# Patient Record
Sex: Female | Born: 1965 | Race: White | Hispanic: No | Marital: Married | Smoking: Never smoker
Health system: Southern US, Community
[De-identification: ages and names within clinical notes are randomized; demographics above are authoritative.]

## PROBLEM LIST (undated history)

## (undated) DIAGNOSIS — L723 Sebaceous cyst: Secondary | ICD-10-CM

## (undated) DIAGNOSIS — L4050 Arthropathic psoriasis, unspecified: Secondary | ICD-10-CM

## (undated) DIAGNOSIS — F419 Anxiety disorder, unspecified: Secondary | ICD-10-CM

## (undated) DIAGNOSIS — Z923 Personal history of irradiation: Secondary | ICD-10-CM

## (undated) DIAGNOSIS — C50919 Malignant neoplasm of unspecified site of unspecified female breast: Secondary | ICD-10-CM

## (undated) DIAGNOSIS — I10 Essential (primary) hypertension: Secondary | ICD-10-CM

## (undated) HISTORY — DX: Anxiety disorder, unspecified: F41.9

## (undated) HISTORY — DX: Personal history of irradiation: Z92.3

## (undated) HISTORY — PX: TOTAL ABDOMINAL HYSTERECTOMY: SHX209

## (undated) HISTORY — PX: TONSILLECTOMY: SUR1361

## (undated) HISTORY — DX: Malignant neoplasm of unspecified site of unspecified female breast: C50.919

## (undated) HISTORY — DX: Essential (primary) hypertension: I10

## (undated) HISTORY — DX: Arthropathic psoriasis, unspecified: L40.50

---

## 2007-02-04 ENCOUNTER — Encounter: Admission: RE | Admit: 2007-02-04 | Discharge: 2007-02-04 | Payer: Self-pay | Admitting: Family Medicine

## 2007-12-12 ENCOUNTER — Encounter: Admission: RE | Admit: 2007-12-12 | Discharge: 2007-12-12 | Payer: Self-pay | Admitting: Family Medicine

## 2008-03-07 ENCOUNTER — Encounter: Admission: RE | Admit: 2008-03-07 | Discharge: 2008-03-07 | Payer: Self-pay | Admitting: Family Medicine

## 2008-11-29 ENCOUNTER — Other Ambulatory Visit: Admission: RE | Admit: 2008-11-29 | Discharge: 2008-11-29 | Payer: Self-pay | Admitting: Family Medicine

## 2008-12-26 ENCOUNTER — Encounter: Admission: RE | Admit: 2008-12-26 | Discharge: 2008-12-26 | Payer: Self-pay | Admitting: Obstetrics and Gynecology

## 2009-01-07 ENCOUNTER — Encounter: Admission: RE | Admit: 2009-01-07 | Discharge: 2009-01-07 | Payer: Self-pay | Admitting: Obstetrics and Gynecology

## 2009-03-08 ENCOUNTER — Encounter: Admission: RE | Admit: 2009-03-08 | Discharge: 2009-03-08 | Payer: Self-pay | Admitting: Family Medicine

## 2009-03-28 ENCOUNTER — Encounter (INDEPENDENT_AMBULATORY_CARE_PROVIDER_SITE_OTHER): Payer: Self-pay | Admitting: Obstetrics and Gynecology

## 2009-03-28 ENCOUNTER — Inpatient Hospital Stay (HOSPITAL_COMMUNITY): Admission: RE | Admit: 2009-03-28 | Discharge: 2009-03-30 | Payer: Self-pay | Admitting: Obstetrics and Gynecology

## 2010-03-10 ENCOUNTER — Encounter: Admission: RE | Admit: 2010-03-10 | Discharge: 2010-03-10 | Payer: Self-pay | Admitting: Physician Assistant

## 2010-09-03 ENCOUNTER — Ambulatory Visit (HOSPITAL_COMMUNITY)
Admission: RE | Admit: 2010-09-03 | Discharge: 2010-09-03 | Payer: Self-pay | Source: Home / Self Care | Attending: Rheumatology | Admitting: Rheumatology

## 2010-11-22 LAB — CBC
HCT: 35.2 % — ABNORMAL LOW (ref 36.0–46.0)
Hemoglobin: 12.2 g/dL (ref 12.0–15.0)
Hemoglobin: 14.2 g/dL (ref 12.0–15.0)
MCHC: 34.5 g/dL (ref 30.0–36.0)
MCV: 97.5 fL (ref 78.0–100.0)
Platelets: 194 10*3/uL (ref 150–400)
RBC: 3.61 MIL/uL — ABNORMAL LOW (ref 3.87–5.11)
RBC: 4.29 MIL/uL (ref 3.87–5.11)
RDW: 13.2 % (ref 11.5–15.5)
RDW: 13.2 % (ref 11.5–15.5)

## 2010-11-22 LAB — URINALYSIS, ROUTINE W REFLEX MICROSCOPIC
Ketones, ur: NEGATIVE mg/dL
Protein, ur: NEGATIVE mg/dL
Urobilinogen, UA: 0.2 mg/dL (ref 0.0–1.0)
pH: 6.5 (ref 5.0–8.0)

## 2010-11-22 LAB — TYPE AND SCREEN
ABO/RH(D): A POS
Antibody Screen: NEGATIVE

## 2010-11-22 LAB — ABO/RH: ABO/RH(D): A POS

## 2010-11-22 LAB — BASIC METABOLIC PANEL
Calcium: 9.8 mg/dL (ref 8.4–10.5)
GFR calc Af Amer: >60 mL/min (ref >60)
Sodium: 135 mEq/L (ref 135–145)

## 2010-11-22 LAB — PREGNANCY, URINE: Preg Test, Ur: NEGATIVE

## 2010-11-22 LAB — URINE MICROSCOPIC-ADD ON

## 2010-11-22 LAB — PROTIME-INR: INR: 1.1 (ref 0.00–1.49)

## 2010-12-30 NOTE — Op Note (Signed)
[name], [name]               ACCOUNT NO.:  [id number]   MEDICAL RECORD NO.:  [id number]          PATIENT TYPE:  INP   LOCATION:  9315                          FACILITY:  [facility]   PHYSICIAN:  Gerald Leitz, MD          DATE OF BIRTH:  03-10-1966   DATE OF PROCEDURE:  03/28/2009  DATE OF DISCHARGE:                               OPERATIVE REPORT   PREOPERATIVE DIAGNOSES:  1. Uterine fibroids.  2. Menorrhagia.   POSTOPERATIVE DIAGNOSES:  1. Uterine fibroids.  2. Menorrhagia.   PROCEDURE:  Total abdominal hysterectomy.   SURGEON:  Gerald Leitz, MD   ASSISTANT:  Bing Neighbors. Delcambre, MD   ANESTHESIA:  General.   COMPLICATIONS:  None.   ESTIMATED BLOOD LOSS:  300 mL.   FLUIDS:  2600 mL.   URINE OUTPUT:  400 mL of clear urine at the end of the procedure.   FINDINGS:  Irregularly enlarged uterus approximately 18 week size,  normal fallopian tubes, and ovaries.  Frozen section of pedunculated  uterine fibroid revealed benign necrotic myoma.   PROCEDURE:  The patient was taken to the operating room where she was  placed under general anesthesia.  She was prepped and draped in the  usual sterile fashion.  A vertical skin incision was made with a scalpel  and carried down to the underlying layer of fascia.  The fascia was  incised and extended superiorly inferiorly.  The rectus muscles were  separated.  The peritoneum was identified, tented up, and entered  sharply with Metzenbaum scissors and peritoneal incision was extended  superiorly and inferiorly with good visualization of the bladder.  The  pelvis was examined with the findings noted above.  A Balfour retractor  was placed into the incision.  The bowel was packed away with moist  laparotomy sponges.  The clamps were placed on the cornu and used for  retraction.  Round ligaments on both sides were clamped, transected, and  suture-ligated with 0 Vicryl.  The anterior leaf of the broad ligament  was incised along the bladder  reflection to the midline from both sides.  The bladder was then gently dissected off the lower uterine segment and  the cervix of the sponge stick.  The utero-ovarian ligaments on both  sides were then clamped, transected, and suture-ligated with 0 Vicryl.  Excellent hemostasis was noted.  Uterine arteries were skeletonized  bilaterally, clamped with Heaney clamps, transected, and suture-ligated  with 0 Vicryl.  Again, excellent hemostasis was noted.  Due to the size  of the fibroid uterus, once the uterine arteries were ligated.  The body  of the uterus was amputated from the cervix with scalpel.  The remaining  cervical stump was grasped with Kocher clamps.  The uterosacral  ligaments were then clamped on both sides, transected, and suture-  ligated with 0 Vicryl.  The cervix was amputated with Jorgenson  scissors.  The vaginal cuff angles were closed with 0 Vicryl and  transfixed to the ipsilateral uterosacral ligament.  The remainder of  cuff was closed with 0 Vicryl in a running locked  fashion.  Excellent  hemostasis was noted.  The pelvis was copiously irrigated with normal  saline.  The right fallopian tube and ovary were anchored to the right  round ligament.  All laparotomy sponges and instruments removed from the  abdomen.  The fascia was closed with double stranded 0 PDS.  Subcutaneous tissue was closed.  Adipose tissue was reapproximated with  2-0 Vicryl.  The skin was closed with staples.  Sponge, lap, and needle  counts were correct x2.  The patient was taken to the recovery room  awake and in stable condition.      Gerald Leitz, MD  Electronically Signed     TC/MEDQ  D:  03/28/2009  T:  03/29/2009  Job:  [id number]

## 2011-02-27 ENCOUNTER — Other Ambulatory Visit: Payer: Self-pay | Admitting: Dermatology

## 2011-02-27 ENCOUNTER — Other Ambulatory Visit: Payer: Self-pay | Admitting: Physician Assistant

## 2011-02-27 DIAGNOSIS — Z1231 Encounter for screening mammogram for malignant neoplasm of breast: Secondary | ICD-10-CM

## 2011-03-02 ENCOUNTER — Other Ambulatory Visit: Payer: Self-pay | Admitting: Gastroenterology

## 2011-03-12 ENCOUNTER — Other Ambulatory Visit: Payer: Self-pay

## 2011-03-17 ENCOUNTER — Ambulatory Visit: Payer: Self-pay

## 2011-03-19 ENCOUNTER — Other Ambulatory Visit: Payer: Self-pay

## 2011-03-20 ENCOUNTER — Ambulatory Visit
Admission: RE | Admit: 2011-03-20 | Discharge: 2011-03-20 | Disposition: A | Discharge status: Home / Self Care | Payer: BC Managed Care – PPO | Source: Ambulatory Visit | Attending: Physician Assistant | Admitting: Physician Assistant

## 2011-03-20 ENCOUNTER — Other Ambulatory Visit: Payer: Self-pay | Admitting: Family Medicine

## 2011-03-20 DIAGNOSIS — Z1231 Encounter for screening mammogram for malignant neoplasm of breast: Secondary | ICD-10-CM

## 2012-04-25 ENCOUNTER — Other Ambulatory Visit: Payer: Self-pay | Admitting: Family Medicine

## 2012-04-25 DIAGNOSIS — Z1231 Encounter for screening mammogram for malignant neoplasm of breast: Secondary | ICD-10-CM

## 2012-05-11 ENCOUNTER — Ambulatory Visit
Admission: RE | Admit: 2012-05-11 | Discharge: 2012-05-11 | Disposition: A | Discharge status: Home / Self Care | Payer: BC Managed Care – PPO | Source: Ambulatory Visit | Attending: Family Medicine | Admitting: Family Medicine

## 2012-05-11 DIAGNOSIS — Z1231 Encounter for screening mammogram for malignant neoplasm of breast: Secondary | ICD-10-CM

## 2012-12-08 ENCOUNTER — Ambulatory Visit (INDEPENDENT_AMBULATORY_CARE_PROVIDER_SITE_OTHER): Payer: BC Managed Care – PPO | Admitting: Internal Medicine

## 2012-12-08 ENCOUNTER — Encounter: Payer: Self-pay | Admitting: Internal Medicine

## 2012-12-08 ENCOUNTER — Other Ambulatory Visit (HOSPITAL_COMMUNITY): Payer: Self-pay | Admitting: Rheumatology

## 2012-12-08 ENCOUNTER — Ambulatory Visit (HOSPITAL_COMMUNITY)
Admission: RE | Admit: 2012-12-08 | Discharge: 2012-12-08 | Disposition: A | Discharge status: Home / Self Care | Payer: BC Managed Care – PPO | Source: Ambulatory Visit | Attending: Rheumatology | Admitting: Rheumatology

## 2012-12-08 ENCOUNTER — Telehealth: Payer: Self-pay | Admitting: Internal Medicine

## 2012-12-08 VITALS — BP 126/82 | HR 81 | Temp 98.1°F | Ht 66.0 in | Wt 200.2 lb

## 2012-12-08 DIAGNOSIS — R05 Cough: Secondary | ICD-10-CM | POA: Insufficient documentation

## 2012-12-08 DIAGNOSIS — R7611 Nonspecific reaction to tuberculin skin test without active tuberculosis: Secondary | ICD-10-CM | POA: Insufficient documentation

## 2012-12-08 DIAGNOSIS — I10 Essential (primary) hypertension: Secondary | ICD-10-CM | POA: Insufficient documentation

## 2012-12-08 DIAGNOSIS — R0989 Other specified symptoms and signs involving the circulatory and respiratory systems: Secondary | ICD-10-CM | POA: Insufficient documentation

## 2012-12-08 DIAGNOSIS — J40 Bronchitis, not specified as acute or chronic: Secondary | ICD-10-CM

## 2012-12-08 DIAGNOSIS — R7612 Nonspecific reaction to cell mediated immunity measurement of gamma interferon antigen response without active tuberculosis: Secondary | ICD-10-CM

## 2012-12-08 DIAGNOSIS — R059 Cough, unspecified: Secondary | ICD-10-CM | POA: Insufficient documentation

## 2012-12-08 NOTE — Telephone Encounter (Signed)
I spoke with the pt and she states she can be her at 1:15pm. Carron Curie, CMA

## 2012-12-08 NOTE — Telephone Encounter (Signed)
Sending to triage due to relative urgency. I need to see this patient today or by next week Monday/tuesday. See if yo ucan get her in today as last visit consult  Dr Pollyann Savoy called  Patient on Enbrel for psoriatic arthritis. Normal CXR 2012. Negative PEED Jan 2012 and March 2013. Now cough with sputum x 2 weeks. Quantiferon gold positive. PPD placed yesterday turning positive. Needs CXR if not already done  Due to her high risk precription, I need to see her sooner. See if you can get her in today. Talk to Philipp Deputy if needed  Patient cell [phone]  Thanks  Dr. Kalman Shan, M.D., [facility]C.P Pulmonary and Critical Care Medicine Staff Physician [facility] Pager: [phone], If no answer or between  15:00h - 7:00h: call [phone]  12/08/2012 11:51 AM

## 2012-12-08 NOTE — Telephone Encounter (Signed)
Per TD try to add the pt on today at 1pm and advise MR office will start then. I have LMTCBx1 with the pt. Please get me when the pt calls back so I can discuss appt with her. I will be in triage or lunch room. Carron Curie, CMA

## 2012-12-08 NOTE — Progress Notes (Signed)
Subjective:    Patient ID: [name], female    DOB: 1966/05/17, 47 y.o.   MRN: [id number] Cc: cough and ppd positive  [facility] Provider Not In System Rheum: Dr Corliss Skains  HPI  IOV 12/08/2012  47 year old obese lady, nonsmoker. At baseline she does not have any respiratory problems. Has visited [place] in 2007 to adopt a baby and reports being crowded places. No definite history of being exposed to anybody with tuberculosis. Psoriatic arthritis and was started on Enbrel in 2011/2012. According to her history PPD test was negative prior to starting Enbrel. Chest x-ray according to her rheumatologist was clear in 2012. Repeat routine PPD in summer of 2013 was negative. She had a quantity on gold test done 2 days ago as part of routine tuberculosis surveillance and this was positive according to her rheumatologist. Therefore yesterday 12/07/2012 she had a PPD placed on her right forearm; read pending  She also developed a cough 2 weeks ago or 3 weeks ago but that seemed to resolve. However, 6 days ago she had acute onset of cough, wheezing and early morning mild white sputum. She is also having mild exertional dyspnea for 1 flight of stairs. She was seen at [facility] urgent care and was given Z-Pak which she completed yesterday. . Since that wheezing is 80% better and the cough is some better. Dyspnea is also improving. At no time she had a fever.   Today the 24th 2014 spirometry shows moderate obstruction. FEV1 is 1.8 L/50%. Ratio 61.  Dg Chest 2 View  12/08/2012  *RADIOLOGY REPORT*  Clinical Data: Bronchitis  CHEST - 2 VIEW  Comparison: September 03, 2010.  Findings: Cardiomediastinal silhouette appears normal.  No acute pulmonary disease is noted.  Bony thorax is intact.  IMPRESSION: No acute cardiopulmonary abnormality seen.   Original Report Authenticated By: Lupita Raider.,  M.D.       Past Medical History  Diagnosis Date  . HTN (hypertension)   . Psoriatic arthritis      No family history on  file.   History   Social History  . Marital Status: Married    Spouse Name: N/A    Number of Children: N/A  . Years of Education: N/A   Occupational History  . none    Social History Main Topics  . Smoking status: Never Smoker   . Smokeless tobacco: Not on file  . Alcohol Use: No  . Drug Use: No  . Sexually Active: Not on file   Other Topics Concern  . Not on file   Social History Narrative  . No narrative on file     Allergies  Allergen Reactions  . Xanax (Alprazolam)     swelling     No outpatient prescriptions prior to visit.   No facility-administered medications prior to visit.     Review of Systems  Constitutional: Negative for fever and unexpected weight change.  HENT: Negative for ear pain, nosebleeds, congestion, sore throat, rhinorrhea, sneezing, trouble swallowing, dental problem, postnasal drip and sinus pressure.   Eyes: Negative for redness and itching.  Respiratory: Positive for cough and shortness of breath. Negative for chest tightness and wheezing.   Cardiovascular: Negative for palpitations and leg swelling.  Gastrointestinal: Negative for nausea and vomiting.  Genitourinary: Negative for dysuria.  Musculoskeletal: Negative for joint swelling.  Skin: Negative for rash.  Neurological: Negative for headaches.  Hematological: Does not bruise/bleed easily.  Psychiatric/Behavioral: Negative for dysphoric mood. The patient is not nervous/anxious.  Objective:   Physical Exam  Vitals reviewed. Constitutional: She is oriented to person, place, and time. She appears well-developed and well-nourished. No distress.  Body mass index is 32.33 kg/(m^2).   HENT:  Head: Normocephalic and atraumatic.  Right Ear: External ear normal.  Left Ear: External ear normal.  Mouth/Throat: Oropharynx is clear and moist. No oropharyngeal exudate.  Eyes: Conjunctivae and EOM are normal. Pupils are equal, round, and reactive to light. Right eye exhibits no  discharge. Left eye exhibits no discharge. No scleral icterus.  Neck: Normal range of motion. Neck supple. No JVD present. No tracheal deviation present. No thyromegaly present.  Cardiovascular: Normal rate, regular rhythm, normal heart sounds and intact distal pulses.  Exam reveals no gallop and no friction rub.   No murmur heard. Pulmonary/Chest: Effort normal and breath sounds normal. No respiratory distress. She has no wheezes. She has no rales. She exhibits no tenderness.  Abdominal: Soft. Bowel sounds are normal. She exhibits no distension and no mass. There is no tenderness. There is no rebound and no guarding.  Musculoskeletal: Normal range of motion. She exhibits no edema and no tenderness.  Arthritis on hand  Lymphadenopathy:    She has no cervical adenopathy.  Neurological: She is alert and oriented to person, place, and time. She has normal reflexes. No cranial nerve deficit. She exhibits normal muscle tone. Coordination normal.  Skin: Skin is warm and dry. No rash noted. She is not diaphoretic. No erythema. No pallor.  Psychiatric: Judgment normal.  anxious          Assessment & Plan:

## 2012-12-08 NOTE — Patient Instructions (Addendum)
#  Cough  - there is no evidence of active pulmonary TB in your lungs based on clear chest xray. Therefore, you are not TB contagious  - cough most likely due to acute bronchitis with ? Asthmatic component - Please start advair 100/50, 1 puff twice daily  #Positive TB Quantiferon Gold   - refer Infectious Diseases to evaluate non-pulmonary questions about this  - hold off enbrel till you see them   #Followup - Return to see me in 1 month or sooner if needed - spirometry at followupg

## 2012-12-12 DIAGNOSIS — R05 Cough: Secondary | ICD-10-CM | POA: Insufficient documentation

## 2012-12-12 DIAGNOSIS — R7612 Nonspecific reaction to cell mediated immunity measurement of gamma interferon antigen response without active tuberculosis: Secondary | ICD-10-CM | POA: Insufficient documentation

## 2012-12-12 NOTE — Assessment & Plan Note (Signed)
She is prior travel history to [place]. PPD skin test has been negative per history in 2012 and 2013. She has been on Enbrel since then in alpha-blocker. Currently per report  Quantiferobn TB Gold test is positive and PPD placed one day prior to this office visit [result is pending]. Chest x-ray is negative and she's not having any systemic symptoms of tuberculosis.  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 Asked her to see infectious diseases consultation

## 2012-12-12 NOTE — Assessment & Plan Note (Signed)
Her acute cough symptoms are not consistent with pulmonary tuberculosis. There no infiltrates. She is obstructive lung disease on spirometry. I wonder if she is mild intermittent asthma. I will place her on Advair and see her in a month. At this point there is no evidence she is contagious

## 2012-12-27 ENCOUNTER — Encounter: Payer: Self-pay | Admitting: Internal Medicine

## 2012-12-27 ENCOUNTER — Ambulatory Visit (INDEPENDENT_AMBULATORY_CARE_PROVIDER_SITE_OTHER): Payer: BC Managed Care – PPO | Admitting: Internal Medicine

## 2012-12-27 VITALS — BP 138/73 | HR 88 | Temp 98.1°F | Wt 203.0 lb

## 2012-12-27 DIAGNOSIS — Z227 Latent tuberculosis: Secondary | ICD-10-CM

## 2012-12-27 DIAGNOSIS — A150 Tuberculosis of lung: Secondary | ICD-10-CM

## 2012-12-27 MED ORDER — RIFAMPIN 300 MG PO CAPS: 600.0000 mg | ORAL_CAPSULE | Freq: Every day | ORAL | Status: DC

## 2012-12-27 NOTE — Progress Notes (Signed)
[facility] CLINIC NOTE  RFV: positive quantiferon for enbrel surveillance Subjective:    Patient ID: [name], female    DOB: 29-Sep-1965, 47 y.o.   MRN: [id number]  HPI  47 yo Psoiratic arthritis , had episode of bronchitis in early April now improved.  Had episodes of elevated ast/alt in the past with enbrel. -> highest of AST 190/ALT 180s. She recently had quantiferon done for the purpose of being on enbrel. She denies having any cough. Has been on enbrel for roughly 18 months. Has been seeing dr. Titus Dubin. She had skin test in the past which were negative. CXR is negative. No fever, chills, nightsweats, nor weight loss.  No recent international travel other than [place] 2008 for 2 wks;    Current Outpatient Prescriptions on File Prior to Visit  Medication Sig Dispense Refill  . albuterol (PROAIR HFA) 108 (90 BASE) MCG/ACT inhaler Inhale 2 puffs into the lungs every 6 (six) hours as needed for wheezing.      . citalopram (CELEXA) 10 MG tablet Take 10 mg by mouth daily.      Marland Kitchen LORazepam (ATIVAN) 0.5 MG tablet Take 0.5 mg by mouth 3 (three) times daily.      Marland Kitchen losartan-hydrochlorothiazide (HYZAAR) 100-12.5 MG per tablet Take 1 tablet by mouth daily.       No current facility-administered medications on file prior to visit.   Active Ambulatory Problems    Diagnosis Date Noted  . 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 Cough 12/12/2012   Resolved Ambulatory Problems    Diagnosis Date Noted  . No Resolved Ambulatory Problems   Past Medical History  Diagnosis Date  . HTN (hypertension)   . Psoriatic arthritis     Review of Systems 12 point ROS is negative    Objective:   Physical Exam BP 138/73  Pulse 88  Temp(Src) 98.1 F (36.7 C) (Oral)  Wt 203 lb (92.08 kg)  BMI 32.78 kg/m2 Physical Exam  Constitutional:  oriented to person, place, and time.  appears well-developed and well-nourished. No distress.  HENT:  Mouth/Throat: Oropharynx is clear and moist. No  oropharyngeal exudate.  Cardiovascular: Normal rate, regular rhythm and normal heart sounds. Exam reveals no gallop and no friction rub.  No murmur heard.  Pulmonary/Chest: Effort normal and breath sounds normal. No respiratory distress. He has no wheezes.  Lymphadenopathy:  no cervical adenopathy.  Neurological: He is alert and oriented to person, place, and time.  Skin: Skin is warm and dry. No rash noted. No erythema.      Old labs: ast 91, alt 81, cr 0.68.  Tb ag value 0.76, qtf nil value 0.04, qtf mitogen 9.17, qtf tb ag minus nil 0.72  Repeat tb ag value 1.12, nil 0.04, mitogen >10, qft-nil 1.08     Assessment & Plan:  Latent tb treatment = will recommend. rifampin 600mg  daily, x 4months, take on a full stomach  Anti-nausea = will call in prn if needed  rtc in 2 wk.

## 2013-01-11 ENCOUNTER — Ambulatory Visit: Payer: BC Managed Care – PPO | Admitting: Internal Medicine

## 2013-01-19 ENCOUNTER — Ambulatory Visit (INDEPENDENT_AMBULATORY_CARE_PROVIDER_SITE_OTHER): Payer: BC Managed Care – PPO | Admitting: Internal Medicine

## 2013-01-19 ENCOUNTER — Encounter: Payer: Self-pay | Admitting: Internal Medicine

## 2013-01-19 VITALS — BP 129/75 | HR 81 | Temp 98.1°F | Wt 201.0 lb

## 2013-01-19 DIAGNOSIS — A150 Tuberculosis of lung: Secondary | ICD-10-CM

## 2013-01-19 DIAGNOSIS — Z227 Latent tuberculosis: Secondary | ICD-10-CM

## 2013-01-19 LAB — COMPREHENSIVE METABOLIC PANEL
ALT: 57 U/L — ABNORMAL HIGH (ref 0–35)
CO2: 28 mEq/L (ref 19–32)
Calcium: 10.1 mg/dL (ref 8.4–10.5)
Chloride: 100 mEq/L (ref 96–112)
Creat: 0.56 mg/dL (ref 0.50–1.10)
Glucose, Bld: 110 mg/dL — ABNORMAL HIGH (ref 70–99)
Sodium: 138 mEq/L (ref 135–145)
Total Protein: 7.4 g/dL (ref 6.0–8.3)

## 2013-01-19 NOTE — Progress Notes (Signed)
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   MRN: [id number]  HPI 47 yo F with psoriatic arthritis on enbrel. Recently tested positive for TB via quantiferon. Last seen on 5/13. Started rifampin, 17 days ago, started on may 17th. Doing well. Having some increase joint pain but not excess since stopping enbrel. Doing ok with rifampin, sometimes get ha and mild nausea. maneageable without meds.  Current Outpatient Prescriptions on File Prior to Visit  Medication Sig Dispense Refill  . albuterol (PROAIR HFA) 108 (90 BASE) MCG/ACT inhaler Inhale 2 puffs into the lungs every 6 (six) hours as needed for wheezing.      . citalopram (CELEXA) 10 MG tablet Take 10 mg by mouth daily.      Marland Kitchen LORazepam (ATIVAN) 0.5 MG tablet Take 0.5 mg by mouth 3 (three) times daily.      Marland Kitchen losartan-hydrochlorothiazide (HYZAAR) 100-12.5 MG per tablet Take 1 tablet by mouth daily.      . rifampin (RIFADIN) 300 MG capsule Take 2 capsules (600 mg total) by mouth daily.  60 capsule  3   No current facility-administered medications on file prior to visit.   Active Ambulatory Problems    Diagnosis Date Noted  . 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 Cough 12/12/2012   Resolved Ambulatory Problems    Diagnosis Date Noted  . No Resolved Ambulatory Problems   Past Medical History  Diagnosis Date  . HTN (hypertension)   . Psoriatic arthritis    Social and family hx : unchanged since the last visit  Review of Systems 12 point ROS negative except positive pertinent listed in hpi. A few blotches of affected skin and scalp that are getting better with sun exposure    Objective:   Physical Exam BP 129/75  Pulse 81  Temp(Src) 98.1 F (36.7 C) (Oral)  Wt 201 lb (91.173 kg)  BMI 32.46 kg/m2 Physical Exam  Constitutional:  oriented to person, place, and time.  appears well-developed and  well-nourished. No distress.  HENT:  Mouth/Throat: Oropharynx is clear and moist. No oropharyngeal exudate.  Cardiovascular: Normal rate, regular rhythm and normal heart sounds. Exam reveals no gallop and no friction rub.  No murmur heard.  Pulmonary/Chest: Effort normal and breath sounds normal. No respiratory distress. He has no wheezes.  Abdominal: Soft. Bowel sounds are normal.  exhibits no distension. There is no tenderness.  Lymphadenopathy:  He has no cervical adenopathy.  Neurological: alert and oriented to person, place, and time.  Skin: Skin is warm and dry. No rash noted. No erythema.  Psychiatric:  a normal mood and affect.  behavior is normal.      Assessment & Plan:  Therapeutic drug monitoring while on rifampin = will check cmp today  Latent tb treatment = continue rifampin 300mg  BID to finish mid September  rtc in mid September

## 2013-02-09 ENCOUNTER — Other Ambulatory Visit: Payer: BC Managed Care – PPO

## 2013-02-15 ENCOUNTER — Other Ambulatory Visit: Payer: Self-pay | Admitting: Internal Medicine

## 2013-02-15 ENCOUNTER — Other Ambulatory Visit (INDEPENDENT_AMBULATORY_CARE_PROVIDER_SITE_OTHER): Payer: BC Managed Care – PPO

## 2013-02-15 DIAGNOSIS — Z227 Latent tuberculosis: Secondary | ICD-10-CM

## 2013-02-15 DIAGNOSIS — A150 Tuberculosis of lung: Secondary | ICD-10-CM

## 2013-02-15 LAB — COMPREHENSIVE METABOLIC PANEL
ALT: 39 U/L — ABNORMAL HIGH (ref 0–35)
Alkaline Phosphatase: 77 U/L (ref 39–117)
Sodium: 138 mEq/L (ref 135–145)
Total Bilirubin: 0.8 mg/dL (ref 0.3–1.2)
Total Protein: 7.3 g/dL (ref 6.0–8.3)

## 2013-03-01 ENCOUNTER — Telehealth: Payer: Self-pay | Admitting: *Deleted

## 2013-03-01 NOTE — Telephone Encounter (Signed)
The patient has been following her lab work in [facility] and noticed that her blood glucose has been increasing with each blood draw.  RN spoke with pt who recalls having eaten breakfast prior to both blood draws.  RN also referred pt to look at [facility] for an older sample that also showed elevated glucose level.  RN advised that pt could talk with Dr. Corliss Skains about her results.  Pt to call [facility] back to schedule follow-up lab and MD appts.  Pt verbalized understanding.

## 2013-04-27 ENCOUNTER — Telehealth: Payer: Self-pay | Admitting: *Deleted

## 2013-04-27 NOTE — Telephone Encounter (Signed)
Returned call to patient in reference to message she left. She advised she has finished taking her Rifampin and she wants to know what to do now. Had to leave a message for her to call the clinic and schedule a visit for the first available appt.

## 2013-05-03 ENCOUNTER — Telehealth: Payer: Self-pay

## 2013-05-03 NOTE — Telephone Encounter (Signed)
Patient calling.  She has completed required treatment for latent TB.  She was told to follow up mid September with labs. She is also seeing rhematoilogist that will require blood work.  What labs would you like prior to her visit?  Pt would like to have those done all at one place with one blood draw.

## 2013-05-03 NOTE — Telephone Encounter (Signed)
Left message that Dr. Drue Second would like her to have a CMP for liver function evaluation as she has completed her antibiotic therapy.  Per Dr. Drue Second, it is ok for her Rheumatologist to draw the lab and share the results with [place]. Andree Coss, RN

## 2013-05-08 ENCOUNTER — Ambulatory Visit: Payer: BC Managed Care – PPO | Admitting: Internal Medicine

## 2013-06-12 ENCOUNTER — Other Ambulatory Visit: Payer: Self-pay

## 2013-06-12 DIAGNOSIS — Z1231 Encounter for screening mammogram for malignant neoplasm of breast: Secondary | ICD-10-CM

## 2013-06-22 ENCOUNTER — Other Ambulatory Visit: Payer: Self-pay

## 2013-07-06 ENCOUNTER — Ambulatory Visit
Admission: RE | Admit: 2013-07-06 | Discharge: 2013-07-06 | Disposition: A | Discharge status: Home / Self Care | Payer: BC Managed Care – PPO | Source: Ambulatory Visit

## 2013-07-06 DIAGNOSIS — Z1231 Encounter for screening mammogram for malignant neoplasm of breast: Secondary | ICD-10-CM

## 2014-06-25 ENCOUNTER — Other Ambulatory Visit: Payer: Self-pay

## 2014-06-25 DIAGNOSIS — Z1231 Encounter for screening mammogram for malignant neoplasm of breast: Secondary | ICD-10-CM

## 2014-07-18 ENCOUNTER — Ambulatory Visit
Admission: RE | Admit: 2014-07-18 | Discharge: 2014-07-18 | Disposition: A | Discharge status: Home / Self Care | Payer: BC Managed Care – PPO | Source: Ambulatory Visit

## 2014-07-18 DIAGNOSIS — Z1231 Encounter for screening mammogram for malignant neoplasm of breast: Secondary | ICD-10-CM

## 2015-08-16 ENCOUNTER — Other Ambulatory Visit (HOSPITAL_COMMUNITY): Payer: Self-pay | Admitting: Rheumatology

## 2015-08-16 DIAGNOSIS — M533 Sacrococcygeal disorders, not elsewhere classified: Secondary | ICD-10-CM

## 2015-08-18 HISTORY — PX: REDUCTION MAMMAPLASTY: SUR839

## 2015-08-18 HISTORY — PX: MASTECTOMY: SHX3

## 2015-08-18 HISTORY — PX: BREAST BIOPSY: SHX20

## 2015-08-29 ENCOUNTER — Ambulatory Visit (HOSPITAL_COMMUNITY): Payer: BLUE CROSS/BLUE SHIELD

## 2015-10-28 ENCOUNTER — Other Ambulatory Visit: Payer: Self-pay

## 2015-10-28 DIAGNOSIS — Z1231 Encounter for screening mammogram for malignant neoplasm of breast: Secondary | ICD-10-CM

## 2015-10-30 ENCOUNTER — Other Ambulatory Visit: Payer: Self-pay | Admitting: Obstetrics and Gynecology

## 2015-10-30 DIAGNOSIS — N63 Unspecified lump in unspecified breast: Secondary | ICD-10-CM

## 2015-11-05 ENCOUNTER — Ambulatory Visit
Admission: RE | Admit: 2015-11-05 | Discharge: 2015-11-05 | Disposition: A | Discharge status: Home / Self Care | Payer: BLUE CROSS/BLUE SHIELD | Source: Ambulatory Visit | Attending: Obstetrics and Gynecology | Admitting: Obstetrics and Gynecology

## 2015-11-05 ENCOUNTER — Other Ambulatory Visit: Payer: Self-pay | Admitting: Obstetrics and Gynecology

## 2015-11-05 DIAGNOSIS — N63 Unspecified lump in unspecified breast: Secondary | ICD-10-CM

## 2015-11-06 ENCOUNTER — Other Ambulatory Visit: Payer: Self-pay | Admitting: Obstetrics and Gynecology

## 2015-11-06 DIAGNOSIS — N63 Unspecified lump in unspecified breast: Secondary | ICD-10-CM

## 2015-11-07 ENCOUNTER — Ambulatory Visit
Admission: RE | Admit: 2015-11-07 | Discharge: 2015-11-07 | Disposition: A | Discharge status: Home / Self Care | Payer: BLUE CROSS/BLUE SHIELD | Source: Ambulatory Visit | Attending: Obstetrics and Gynecology | Admitting: Obstetrics and Gynecology

## 2015-11-07 DIAGNOSIS — N63 Unspecified lump in unspecified breast: Secondary | ICD-10-CM

## 2015-11-08 ENCOUNTER — Telehealth: Payer: Self-pay | Admitting: *Deleted

## 2015-11-08 DIAGNOSIS — C50411 Malignant neoplasm of upper-outer quadrant of right female breast: Secondary | ICD-10-CM

## 2015-11-08 NOTE — Telephone Encounter (Signed)
Confirmed [facility] for 11/13/15 at 1215pm.   Instructions and contact information given.

## 2015-11-13 ENCOUNTER — Ambulatory Visit: Payer: BLUE CROSS/BLUE SHIELD | Attending: General Surgery | Admitting: Physical Therapy

## 2015-11-13 ENCOUNTER — Other Ambulatory Visit: Payer: Self-pay | Admitting: *Deleted

## 2015-11-13 ENCOUNTER — Encounter: Payer: Self-pay | Admitting: Hematology and Oncology

## 2015-11-13 ENCOUNTER — Other Ambulatory Visit (HOSPITAL_BASED_OUTPATIENT_CLINIC_OR_DEPARTMENT_OTHER): Payer: BLUE CROSS/BLUE SHIELD

## 2015-11-13 ENCOUNTER — Ambulatory Visit (HOSPITAL_BASED_OUTPATIENT_CLINIC_OR_DEPARTMENT_OTHER): Payer: BLUE CROSS/BLUE SHIELD | Admitting: Hematology and Oncology

## 2015-11-13 ENCOUNTER — Encounter: Payer: Self-pay | Admitting: Physical Therapy

## 2015-11-13 ENCOUNTER — Encounter: Payer: Self-pay | Admitting: Nurse Practitioner

## 2015-11-13 ENCOUNTER — Ambulatory Visit
Admission: RE | Admit: 2015-11-13 | Discharge: 2015-11-13 | Disposition: A | Discharge status: Home / Self Care | Payer: BLUE CROSS/BLUE SHIELD | Source: Ambulatory Visit | Attending: Radiation Oncology | Admitting: Radiation Oncology

## 2015-11-13 VITALS — BP 144/70 | HR 86 | Temp 99.0°F | Resp 19 | Wt 227.5 lb

## 2015-11-13 DIAGNOSIS — C50411 Malignant neoplasm of upper-outer quadrant of right female breast: Secondary | ICD-10-CM

## 2015-11-13 DIAGNOSIS — Z170 Estrogen receptor positive status [ER+]: Secondary | ICD-10-CM

## 2015-11-13 DIAGNOSIS — R293 Abnormal posture: Secondary | ICD-10-CM

## 2015-11-13 DIAGNOSIS — C50211 Malignant neoplasm of upper-inner quadrant of right female breast: Secondary | ICD-10-CM | POA: Diagnosis not present

## 2015-11-13 LAB — COMPREHENSIVE METABOLIC PANEL
ALT: 72 U/L — AB (ref 0–55)
AST: 104 U/L — AB (ref 5–34)
Albumin: 3.5 g/dL (ref 3.5–5.0)
Alkaline Phosphatase: 93 U/L (ref 40–150)
Anion Gap: 7 mEq/L (ref 3–11)
BILIRUBIN TOTAL: 1.32 mg/dL — AB (ref 0.20–1.20)
BUN: 12.3 mg/dL (ref 7.0–26.0)
CO2: 28 meq/L (ref 22–29)
CREATININE: 0.7 mg/dL (ref 0.6–1.1)
Calcium: 9 mg/dL (ref 8.4–10.4)
Chloride: 102 mEq/L (ref 98–109)
GLUCOSE: 137 mg/dL (ref 70–140)
Potassium: 3.9 mEq/L (ref 3.5–5.1)
SODIUM: 138 meq/L (ref 136–145)
TOTAL PROTEIN: 7.7 g/dL (ref 6.4–8.3)

## 2015-11-13 LAB — CBC WITH DIFFERENTIAL/PLATELET
BASO%: 0.4 % (ref 0.0–2.0)
Basophils Absolute: 0 10*3/uL (ref 0.0–0.1)
EOS%: 2.7 % (ref 0.0–7.0)
Eosinophils Absolute: 0.2 10*3/uL (ref 0.0–0.5)
HCT: 41.5 % (ref 34.8–46.6)
HEMOGLOBIN: 13.9 g/dL (ref 11.6–15.9)
LYMPH%: 28.5 % (ref 14.0–49.7)
MCH: 32.9 pg (ref 25.1–34.0)
MCHC: 33.5 g/dL (ref 31.5–36.0)
MCV: 98.4 fL (ref 79.5–101.0)
MONO#: 0.9 10*3/uL (ref 0.1–0.9)
MONO%: 14.9 % — AB (ref 0.0–14.0)
NEUT%: 53.5 % (ref 38.4–76.8)
NEUTROS ABS: 3.3 10*3/uL (ref 1.5–6.5)
Platelets: 110 10*3/uL — ABNORMAL LOW (ref 145–400)
RBC: 4.21 10*6/uL (ref 3.70–5.45)
RDW: 13.4 % (ref 11.2–14.5)
WBC: 6.2 10*3/uL (ref 3.9–10.3)
lymph#: 1.8 10*3/uL (ref 0.9–3.3)

## 2015-11-13 NOTE — Assessment & Plan Note (Signed)
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  Pathology and radiology counseling:Discussed with the patient, the details of pathology including the type of breast cancer,the clinical staging, the significance of ER, PR and HER-2/neu receptors and the implications for treatment. After reviewing the pathology in detail, we proceeded to discuss the different treatment options between surgery, radiation, chemotherapy, antiestrogen therapies.  Recommendations: 1. Staging CT chest abdomen pelvis and bone scan ( due to elevated LFTs) 2. Breast conserving surgery followed by 3. Oncotype DX testing to determine if chemotherapy would be of any benefit followed by 4. Adjuvant radiation therapy followed by 4. Adjuvant antiestrogen therapy  Oncotype counseling: I discussed Oncotype DX test. I explained to the patient that this is a 21 gene panel to evaluate patient tumors DNA to calculate recurrence score. This would help determine whether patient has high risk or intermediate risk or low risk breast cancer. She understands that if her tumor was found to be high risk, she would benefit from systemic chemotherapy. If low risk, no need of chemotherapy. If she was found to be intermediate risk, we would need to evaluate the score as well as other risk factors and determine if an abbreviated chemotherapy may be of benefit.  Return to clinic after surgery to discuss final pathology report and then determine if Oncotype DX testing will need to be sent.

## 2015-11-13 NOTE — Progress Notes (Signed)
Radiation Oncology         [phone] ________________________________  Initial Outpatient Consultation  Name: [name] MRN: [id number]  Date: 11/13/2015  DOB: Apr 29, 1966  TO:IZTIWP, Gwyndolyn Saxon, MD  Fanny Skates, MD   REFERRING PHYSICIAN: Fanny Skates, MD  DIAGNOSIS: The encounter diagnosis was Breast cancer of upper-outer quadrant of right female breast [facility]). Invasive Ductal Carcinoma of the Right Breast; ER/PR positive, Her-2 negative, Ki67 of 7%., T2 N0 stage II a clinical stage.     Breast cancer of upper-outer quadrant of right female breast ([place])   11/07/2015 Initial Diagnosis Right breast biopsy 12:30 position: Invasive ductal carcinoma grade 2, ER/PR positive, HER-2 negative ratio 1.17, Ki-67 7%, ultrasound revealed 2.7 cm mass, T2 N0 stage II a clinical stage     HISTORY OF PRESENT ILLNESS::[name] is a 50 y.o. female who presented with a palpable mass in the right breast. Ultrasound revealed a 2.7 cm mass. Biopsy on 11/07/2015 revealed invasive ductal carcinoma ER/PR positive, Her-2 negative, with a Ki67 of 7%. She presents today to [facility] for consultation. MRI has been scheduled for 11/14/2015.   PREVIOUS RADIATION THERAPY: No  OBGYN HISTORY:  Age at first menstrual period: 11 Still having periods?: No (Hysterectomy) Approximate date of last period: 2009 Ever used hormone replacement?: No # of children carried to term: 0 Pregnant now or trying to get pregnant?: No Ever used birth control pills or hormone shots for contraception?: No Ever had colonoscopy: No Ever had a bone density: No  Date of last PAP smear: "within a year" Date of FIRST mammogram: (age) 17   PAST MEDICAL HISTORY:  has a past medical history of HTN (hypertension); Psoriatic arthritis ([place]); Breast cancer ([place]); and Anxiety.    PAST SURGICAL HISTORY: Past Surgical History  Procedure Laterality Date  . Total abdominal hysterectomy    . Tonsillectomy       FAMILY HISTORY: family history includes Cancer - Colon in her cousin.  SOCIAL HISTORY:  reports that she has never smoked. She does not have any smokeless tobacco history on file. She reports that she drinks alcohol. She reports that she does not use illicit drugs.  ALLERGIES: Xanax and Tape  MEDICATIONS:  Current Outpatient Prescriptions  Medication Sig Dispense Refill  . citalopram (CELEXA) 10 MG tablet Take 10 mg by mouth daily.    Scarlette Shorts SURECLICK 50 MG/ML injection   1  . LORazepam (ATIVAN) 0.5 MG tablet Take 1 mg by mouth every 6 (six) hours as needed for anxiety.     Marland Kitchen losartan-hydrochlorothiazide (HYZAAR) 100-12.5 MG per tablet Take 1 tablet by mouth daily.     No current facility-administered medications for this encounter.    REVIEW OF SYSTEMS:  A 15 point review of systems is documented in the electronic medical record. This was obtained by the nursing staff. However, I reviewed this with the patient to discuss relevant findings and make appropriate changes.  Pertinent items are noted in HPI. No nipple discharge or bleeding   PHYSICAL EXAM: BP: 144/70, Pulse Rate: 86, Resp: 19, Temp: 99 F, Temp Source: Oral, SpO2: 98%, Weight: 227 lb 8 oz,   No palpable cervical or supraclavicular lymphadenopathy. Heart is regular in rate and rhythm. Lungs are clear to auscultation bilaterally. Abdomen is soft, non-tender, and non-distended.  Left breast: No palpable mass or nipple discharge Right breast: Bruising in the upper-outer quadrant from biopsy is noted. Palpable mass approximately 2.5 cm in size in the upper-outer quadrant located approximately 3-4 cm  from the areolar border. No nipple discharge or bleeding noted.  ECOG = 0  LABORATORY DATA:  Lab Results  Component Value Date   WBC 6.2 11/13/2015   HGB 13.9 11/13/2015   HCT 41.5 11/13/2015   MCV 98.4 11/13/2015   PLT 110* 11/13/2015   NEUTROABS 3.3 11/13/2015   Lab Results  Component Value Date   NA 138 11/13/2015    K 3.9 11/13/2015   CL 99 02/15/2013   CO2 28 11/13/2015   GLUCOSE 137 11/13/2015   CREATININE 0.7 11/13/2015   CALCIUM 9.0 11/13/2015      RADIOGRAPHY: Mm Digital Diagnostic Unilat R  11/07/2015  CLINICAL DATA:  Patient status post ultrasound-guided core needle biopsy right breast mass. EXAM: DIAGNOSTIC RIGHT MAMMOGRAM POST ULTRASOUND BIOPSY COMPARISON:  Previous exam(s). FINDINGS: Mammographic images were obtained following ultrasound guided biopsy of right breast mass 1230 o'clock. Heart shaped marking clip in appropriate position IMPRESSION: Appropriate position heart shaped marking clip status post ultrasound-guided core needle biopsy right breast mass. Final Assessment: Post Procedure Mammograms for Marker Placement Electronically Signed   By: Lovey Newcomer M.D.   On: 11/07/2015 14:15   US Breast Ltd Uni Right Inc Axilla  11/05/2015  CLINICAL DATA:  Patient presents with a palpable mass in the upper outer right breast. EXAM: 2D DIGITAL DIAGNOSTIC BILATERAL MAMMOGRAM WITH CAD AND ADJUNCT TOMO ULTRASOUND RIGHT BREAST COMPARISON:  Previous exam(s). ACR Breast Density Category c: The breast tissue is heterogeneously dense, which may obscure small masses. FINDINGS: New focal density has developed in the upper outer right breast corresponding to the palpable abnormality on the 2D images. On 3D images, there is a mass with spiculated margins, measuring approximately 2.6 cm in diameter, in the upper outer quadrant, which corresponds to palpable abnormality. There are no other suspicious or new breast masses. There is some architectural distortion associated with the mass in the upper outer quadrant of the right breast. There is no other architectural distortion. There are no suspicious calcifications. Mammographic images were processed with CAD. On physical exam, a firm mass is palpated in the upper outer right breast. Targeted ultrasound is performed, showing a hypoechoic, floor than wide, shadowing mass  with the regular and partly ill-defined margins in the right breast at 12:30 o'clock, 4 cm the nipple, measuring 2.5 x 2.5 x 2.7 cm. Evaluation of the right axilla shows no enlarged or abnormal appearing lymph nodes. IMPRESSION: 2.7 cm right breast mass highly suspicious for a primary breast malignancy. RECOMMENDATION: Ultrasound-guided core needle biopsy. I have discussed the findings and recommendations with the patient. Results were also provided in writing at the conclusion of the visit. If applicable, a reminder letter will be sent to the patient regarding the next appointment. BI-RADS CATEGORY  5: Highly suggestive of malignancy. Electronically Signed   By: Lajean Manes M.D.   On: 11/05/2015 15:21   Mm Diag Breast Tomo Bilateral  11/05/2015  CLINICAL DATA:  Patient presents with a palpable mass in the upper outer right breast. EXAM: 2D DIGITAL DIAGNOSTIC BILATERAL MAMMOGRAM WITH CAD AND ADJUNCT TOMO ULTRASOUND RIGHT BREAST COMPARISON:  Previous exam(s). ACR Breast Density Category c: The breast tissue is heterogeneously dense, which may obscure small masses. FINDINGS: New focal density has developed in the upper outer right breast corresponding to the palpable abnormality on the 2D images. On 3D images, there is a mass with spiculated margins, measuring approximately 2.6 cm in diameter, in the upper outer quadrant, which corresponds to palpable abnormality. There are no other  suspicious or new breast masses. There is some architectural distortion associated with the mass in the upper outer quadrant of the right breast. There is no other architectural distortion. There are no suspicious calcifications. Mammographic images were processed with CAD. On physical exam, a firm mass is palpated in the upper outer right breast. Targeted ultrasound is performed, showing a hypoechoic, floor than wide, shadowing mass with the regular and partly ill-defined margins in the right breast at 12:30 o'clock, 4 cm the nipple,  measuring 2.5 x 2.5 x 2.7 cm. Evaluation of the right axilla shows no enlarged or abnormal appearing lymph nodes. IMPRESSION: 2.7 cm right breast mass highly suspicious for a primary breast malignancy. RECOMMENDATION: Ultrasound-guided core needle biopsy. I have discussed the findings and recommendations with the patient. Results were also provided in writing at the conclusion of the visit. If applicable, a reminder letter will be sent to the patient regarding the next appointment. BI-RADS CATEGORY  5: Highly suggestive of malignancy. Electronically Signed   By: Lajean Manes M.D.   On: 11/05/2015 15:21   [place] Rt Breast Bx W Loc Dev 1st Lesion Img Bx Spec US Guide  11/08/2015  ADDENDUM REPORT: 11/08/2015 11:28 ADDENDUM: Pathology revealed grade II invasive ductal carcinoma in the right breast. This was found to be concordant by Dr. Lovey Newcomer. Pathology results were discussed with the patient by telephone. The patient reported doing well after the biopsy. Post biopsy instructions and care were reviewed and questions were answered. The patient was encouraged to call [facility] for any additional concerns. The patient was referred to the [facility] at [facility] on November 13, 2015. Pathology results reported by Susa Raring RN, BSN on 11/08/2015. Electronically Signed   By: Lovey Newcomer M.D.   On: 11/08/2015 11:28  11/08/2015  CLINICAL DATA:  Patient with suspicious right breast mass. EXAM: ULTRASOUND GUIDED RIGHT BREAST CORE NEEDLE BIOPSY COMPARISON:  Previous exam(s). FINDINGS: I met with the patient and we discussed the procedure of ultrasound-guided biopsy, including benefits and alternatives. We discussed the high likelihood of a successful procedure. We discussed the risks of the procedure, including infection, bleeding, tissue injury, clip migration, and inadequate sampling. Informed written consent was given. The usual time-out  protocol was performed immediately prior to the procedure. Using sterile technique and 1% Lidocaine as local anesthetic, under direct ultrasound visualization, a 12 gauge spring-loaded device was used to perform biopsy of right breast mass 1230 o'clock using a lateral approach. At the conclusion of the procedure a heart shaped tissue marker clip was deployed into the biopsy cavity. Follow up 2 view mammogram was performed and dictated separately. IMPRESSION: Ultrasound guided biopsy of right breast mass. No apparent complications. Electronically Signed: By: Lovey Newcomer M.D. On: 11/07/2015 14:16      IMPRESSION: [name] is a 50 y.o female with invasive ductal carcinoma of the right breast; ER/PR positive, Her-2 negative, Ki67 of 7%. The patient would appear to be a good candidate for breast conservation therapy; however, we will wait to make sure the lesion is not too large and to rule out other lesions on MRI. Patient's liver function studies are elevated and she will proceed with a bone scan and CT scans to rule out metastatic disease.   PLAN: The patient will meet with Dr. Dalbert Batman after her MRI for discussion of surgical options. Patient will also have Oncotype DX testing to determine if chemotherapy would be benefit. I  anticipate breast conservation surgery with adjuvant radiation therapy to follow. Adjuvant antiestrogen therapy at a later date.   ------------------------------------------------  Blair Promise, PhD, MD   This document serves as a record of services personally performed by Gery Pray, MD. It was created on his behalf by Jenell Milliner, a trained medical scribe. The creation of this record is based on the scribe's personal observations and the provider's statements to them. This document has been checked and approved by the attending provider.

## 2015-11-13 NOTE — Patient Instructions (Signed)

## 2015-11-13 NOTE — Progress Notes (Signed)
[facility] CONSULT NOTE  Patient Care Team: Shirline Frees, MD as PCP - General (Family Medicine) Bo Merino, MD as Referring Physician (Rheumatology)  CHIEF COMPLAINTS/PURPOSE OF CONSULTATION:  Newly diagnosed breast cancer  HISTORY OF PRESENTING ILLNESS:  [name] 50 y.o. female is here because of recent diagnosis of right breast cancer. She had a palpable abnormality in the right breast and underwent a mammogram that revealed a 2.5 x 2.5 x 2.7 cm lesion at 12:30 position. Ultrasound and ultrasound-guided biopsy were performed. Final pathology came back as grade 2 invasive ductal carcinoma that was ER/PR positive HER-2 negative. Ki-67 was 7%. She was presented this morning to the multidisciplinary tumor board and she is here today to discuss a treatment plan.  I reviewed her records extensively and collaborated the history with the patient.  SUMMARY OF ONCOLOGIC HISTORY:   Breast cancer of upper-outer quadrant of right female breast ([place])   11/07/2015 Initial Diagnosis Right breast biopsy 12:30 position: Invasive ductal carcinoma grade 2, ER/PR positive, HER-2 negative ratio 1.17, Ki-67 7%, ultrasound revealed 2.7 cm mass, T2 N0 stage II a clinical stage    In terms of breast cancer risk profile:  She menarched at early age of 76 and is uncertain regarding menopause age because she had a hysterectomy.  She had no pregnancies, she adopted a son She has not received birth control pills.  She was never exposed to fertility medications or hormone replacement therapy.  She has no family history of Breast/GYN cancer Paternal cousin had colon cancer  MEDICAL HISTORY:  Past Medical History  Diagnosis Date  . HTN (hypertension)   . Psoriatic arthritis ([place])   . Breast cancer ([place])   . Anxiety     SURGICAL HISTORY: Past Surgical History  Procedure Laterality Date  . Total abdominal hysterectomy    . Tonsillectomy      SOCIAL HISTORY: Social History    Social History  . Marital Status: Married    Spouse Name: N/A  . Number of Children: N/A  . Years of Education: N/A   Occupational History  . none    Social History Main Topics  . Smoking status: Never Smoker   . Smokeless tobacco: Not on file  . Alcohol Use: Yes  . Drug Use: No  . Sexual Activity: Not on file   Other Topics Concern  . Not on file   Social History Narrative    FAMILY HISTORY: Family History  Problem Relation Age of Onset  . Cancer - Colon Cousin     ALLERGIES:  is allergic to xanax and tape.  MEDICATIONS:  Current Outpatient Prescriptions  Medication Sig Dispense Refill  . citalopram (CELEXA) 10 MG tablet Take 10 mg by mouth daily.    Scarlette Shorts SURECLICK 50 MG/ML injection   1  . LORazepam (ATIVAN) 0.5 MG tablet Take 1 mg by mouth every 6 (six) hours as needed for anxiety.     Marland Kitchen losartan-hydrochlorothiazide (HYZAAR) 100-12.5 MG per tablet Take 1 tablet by mouth daily.     No current facility-administered medications for this visit.    REVIEW OF SYSTEMS:   Constitutional: Denies fevers, chills or abnormal night sweats Eyes: Denies blurriness of vision, double vision or watery eyes Ears, nose, mouth, throat, and face: Denies mucositis or sore throat Respiratory: Denies cough, dyspnea or wheezes Cardiovascular: Denies palpitation, chest discomfort or lower extremity swelling Gastrointestinal:  Denies nausea, heartburn or change in bowel habits Skin: Denies abnormal skin rashes Lymphatics: Denies new lymphadenopathy  or easy bruising Neurological:Denies numbness, tingling or new weaknesses Behavioral/Psych: Mood is stable, no new changes  Breast:  Denies any palpable lumps or discharge All other systems were reviewed with the patient and are negative.  PHYSICAL EXAMINATION: ECOG PERFORMANCE STATUS: 0 - Asymptomatic  Filed Vitals:   11/13/15 1223  BP: 144/70  Pulse: 86  Temp: 99 F (37.2 C)  Resp: 19   Filed Weights   11/13/15 1223   Weight: 227 lb 8 oz (103.193 kg)    GENERAL:alert, no distress and comfortable SKIN: skin color, texture, turgor are normal, no rashes or significant lesions EYES: normal, conjunctiva are pink and non-injected, sclera clear OROPHARYNX:no exudate, no erythema and lips, buccal mucosa, and tongue normal  NECK: supple, thyroid normal size, non-tender, without nodularity LYMPH:  no palpable lymphadenopathy in the cervical, axillary or inguinal LUNGS: clear to auscultation and percussion with normal breathing effort HEART: regular rate & rhythm and no murmurs and no lower extremity edema ABDOMEN:abdomen soft, non-tender and normal bowel sounds Musculoskeletal:no cyanosis of digits and no clubbing  PSYCH: alert & oriented x 3 with fluent speech NEURO: no focal motor/sensory deficits BREAST: No palpable nodules in breast. No palpable axillary or supraclavicular lymphadenopathy (exam performed in the presence of a chaperone)   LABORATORY DATA:  I have reviewed the data as listed Lab Results  Component Value Date   WBC 6.2 11/13/2015   HGB 13.9 11/13/2015   HCT 41.5 11/13/2015   MCV 98.4 11/13/2015   PLT 110* 11/13/2015   Lab Results  Component Value Date   NA 138 11/13/2015   K 3.9 11/13/2015   CL 99 02/15/2013   CO2 28 11/13/2015    RADIOGRAPHIC STUDIES: I have personally reviewed the radiological reports and agreed with the findings in the report.  ASSESSMENT AND PLAN:  Breast cancer of upper-outer quadrant of right female breast ([place]) Right breast biopsy 11/07/2015:12:30 position: Invasive ductal carcinoma grade 2, ER/PR positive, HER-2 negative ratio 1.17, Ki-67 7%, ultrasound revealed 2.7 cm mass, T2 N0 stage II a clinical stage.  Pathology and radiology counseling:Discussed with the patient, the details of pathology including the type of breast cancer,the clinical staging, the significance of ER, PR and HER-2/neu receptors and the implications for treatment. After reviewing  the pathology in detail, we proceeded to discuss the different treatment options between surgery, radiation, chemotherapy, antiestrogen therapies.  Recommendations: 1. Staging CT chest abdomen pelvis and bone scan ( due to elevated LFTs) 2. Breast conserving surgery followed by 3. Oncotype DX testing to determine if chemotherapy would be of any benefit followed by 4. Adjuvant radiation therapy followed by 4. Adjuvant antiestrogen therapy  Oncotype counseling: I discussed Oncotype DX test. I explained to the patient that this is a 21 gene panel to evaluate patient tumors DNA to calculate recurrence score. This would help determine whether patient has high risk or intermediate risk or low risk breast cancer. She understands that if her tumor was found to be high risk, she would benefit from systemic chemotherapy. If low risk, no need of chemotherapy. If she was found to be intermediate risk, we would need to evaluate the score as well as other risk factors and determine if an abbreviated chemotherapy may be of benefit.  Return to clinic after surgery to discuss final pathology report and then determine if Oncotype DX testing will need to be sent.  All questions were answered. The patient knows to call the clinic with any problems, questions or concerns.    Lindi Adie,  Tyler Deis, MD 11/13/2015

## 2015-11-13 NOTE — Therapy (Signed)
[place], [place], [address] Phone: [phone]   Fax:  [phone]  Physical Therapy Evaluation  Patient Details  Name: [name] MRN: [id number] Date of Birth: March 27, 1966 Referring Provider: Dr. Fanny Skates  Encounter Date: 11/13/2015      PT End of Session - 11/13/15 1530    Visit Number 1   Number of Visits 1   PT Start Time 1440   PT Stop Time 1506   PT Time Calculation (min) 26 min   Activity Tolerance Patient tolerated treatment well   Behavior During Therapy [facility] for tasks assessed/performed      Past Medical History  Diagnosis Date  . HTN (hypertension)   . Psoriatic arthritis ([place])   . Breast cancer ([place])   . Anxiety     Past Surgical History  Procedure Laterality Date  . Total abdominal hysterectomy    . Tonsillectomy      There were no vitals filed for this visit.  Visit Diagnosis:  Carcinoma of right breast upper inner quadrant ([place]) - Plan: PT plan of care cert/re-cert  Abnormal posture - Plan: PT plan of care cert/re-cert      Subjective Assessment - 11/13/15 1517    Subjective Patient reports she is here to meet with her medical team for her newly diagnosed right breast cancer.   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  It is located in the upper inner quadrant at 12:30 and measures 2.7 cm.  It is ER/PR positive and HER2 negative.   Patient Stated Goals Reduce lymphedema risk and learn post op shoulder ROM HEP   Currently in Pain? Yes   Pain Score 0-No pain  and can increase to 9/10 at times.   Pain Location Back   Pain Orientation Lower   Pain Descriptors / Indicators Aching   Pain Type Chronic pain   Pain Onset More than a month ago   Pain Frequency Intermittent   Aggravating Factors  standing, walking   Pain Relieving Factors sitting   Multiple Pain Sites No            [facility] PT Assessment - 11/13/15 0001    Assessment   Medical Diagnosis Right breast cancer   Referring Provider Dr. Fanny Skates   Onset Date/Surgical Date 11/05/15   Hand Dominance Right   Prior Therapy none   Precautions   Precautions Other (comment)   Precaution Comments active breast cancer   Restrictions   Weight Bearing Restrictions No   Balance Screen   Has the patient fallen in the past 6 months No   Has the patient had a decrease in activity level because of a fear of falling?  No   Is the patient reluctant to leave their home because of a fear of falling?  No   Home Environment   Living Environment Private residence   Living Arrangements Spouse/significant other;Children  Husband and 52 y.o. 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   Leisure Exercises in a pool during the summer   Cognition   Overall Cognitive Status Within Functional Limits for tasks assessed   Posture/Postural Control   Posture/Postural Control Postural limitations   Postural Limitations Rounded Shoulders;Forward head   ROM / Strength   AROM / PROM /  Strength AROM;Strength   AROM   AROM Assessment Site Shoulder   Right/Left Shoulder Right;Left   Right Shoulder Extension 70 Degrees   Right Shoulder Flexion 156 Degrees   Right Shoulder ABduction 160 Degrees   Right Shoulder Internal Rotation 70 Degrees   Right Shoulder External Rotation 75 Degrees   Left Shoulder Extension 68 Degrees   Left Shoulder Flexion 162 Degrees   Left Shoulder ABduction 167 Degrees   Left Shoulder Internal Rotation 78 Degrees   Left Shoulder External Rotation 77 Degrees   Strength   Overall Strength Within functional limits for tasks performed           LYMPHEDEMA/ONCOLOGY QUESTIONNAIRE - 11/13/15 1528    Type   Cancer Type Right breast cancer   Lymphedema Assessments    Lymphedema Assessments Upper extremities   Right Upper Extremity Lymphedema   10 cm Proximal to Olecranon Process 33.3 cm   Olecranon Process 27.9 cm   10 cm Proximal to Ulnar Styloid Process 25.8 cm   Just Proximal to Ulnar Styloid Process 17.6 cm   Across Hand at [employer] 19.8 cm   At [place] of 2nd Digit 7.1 cm   Left Upper Extremity Lymphedema   10 cm Proximal to Olecranon Process 35.2 cm   Olecranon Process 28.4 cm   10 cm Proximal to Ulnar Styloid Process 26 cm   Just Proximal to Ulnar Styloid Process 17.7 cm   Across Hand at [employer] 20.1 cm   At [place] of 2nd Digit 6.9 cm      Patient was instructed today in a home exercise program today for post op shoulder range of motion. These included active assist shoulder flexion in sitting, scapular retraction, wall walking with shoulder abduction, and hands behind head external rotation.  She was encouraged to do these twice a day, holding 3 seconds and repeating 5 times when permitted by her physician.         PT Education - 11/13/15 1529    Education provided Yes   Education Details Lymphedema risk reduction and post op shoulder ROM HEP   Person(s) Educated Patient;Spouse   Methods Explanation;Demonstration;Handout   Comprehension Returned demonstration;Verbalized understanding              Breast Clinic Goals - 11/13/15 1539    Patient will be able to verbalize understanding of pertinent lymphedema risk reduction practices relevant to her diagnosis specifically related to skin care.   Time 1   Period Days   Status Achieved   Patient will be able to return demonstrate and/or verbalize understanding of the post-op home exercise program related to regaining shoulder range of motion.   Time 1   Period Days   Status Achieved   Patient will be able to verbalize understanding of the importance of attending the postoperative After Breast Cancer Class for further lymphedema risk reduction education and therapeutic  exercise.   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  It is located in the upper inner quadrant at 12:30 and measures 2.7 cm.  It is ER/PR positive and HER2 negative.  She is planning to have a right lumpectomy and sentinel node biopsy followed by radiation and anti-estrogen therapy.  She will benefit from post op PT  to regain shoulder ROM and reduce lymphedema risk.   Pt will benefit from skilled therapeutic intervention in order to improve on the following deficits Decreased strength;Pain;Decreased knowledge of precautions;Impaired UE functional use;Decreased range of motion   Rehab Potential Excellent   Clinical Impairments Affecting Rehab Potential none   PT Frequency One time visit   PT Treatment/Interventions Therapeutic exercise;Patient/family education   PT Next Visit Plan Will f/u after surgery   PT Home Exercise Plan Issued shoulder ROM HEP for post op   Consulted and Agree with Plan of Care Patient;Family member/caregiver   Family Member Consulted Husband     Patient will follow up at outpatient cancer rehab if needed following surgery.  If the patient requires physical therapy at that time, a specific plan will be dictated and sent to the referring physician for approval. The patient was educated today on appropriate basic range of motion exercises to begin post operatively and the importance of attending the After Breast Cancer class following surgery.  Patient was educated today on lymphedema risk reduction practices as it pertains to recommendations that will benefit the patient immediately following surgery.  She verbalized good understanding.  No additional physical therapy is indicated at this time.       Problem List Patient Active Problem List   Diagnosis Date Noted  . Breast cancer of upper-outer quadrant  of right female breast ([place]) 11/08/2015  . 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 Cough 12/12/2012   Annia Friendly, PT 11/13/2015 3:42 PM  [place], [place], [address] Phone: [phone]   Fax:  [phone]  Name: Alyxandra Tenbrink MRN: [id number] Date of Birth: [birth year]

## 2015-11-13 NOTE — Progress Notes (Signed)
Ms. Waldo is a very pleasant 50 y.o. female from [place], [place] with newly diagnosed grade 2 invasive ductal carcinoma of the right breast.  Biopsy results revealed the tumor's prognostic profile is ER positive, PR positive, and HER2/neu negative.   She presents today with her husband to the [facility]) for treatment consideration and recommendations from the breast surgeon, radiation oncologist, and medical oncologist.     I briefly met with Ms. Wuebker and her husband during her [facility] visit today. We discussed the purpose of the Survivorship Clinic, which will include monitoring for recurrence, coordinating completion of age and gender-appropriate cancer screenings, promotion of overall wellness, as well as managing potential late/long-term side effects of anti-cancer treatments.    The treatment plan for Ms. Cuaresma will likely include surgery, +/- chemotherapy pending oncotype, radiation therapy, and anti-estrogen therapy.  As of today, the intent of treatment for Ms. Hight is cure, therefore she will be eligible for the Survivorship Clinic upon her completion of treatment.  Her survivorship care plan (SCP) document will be drafted and updated throughout the course of her treatment trajectory. She will receive the SCP in an office visit with myself in the Survivorship Clinic once she has completed treatment.   Ms. Jhaveri was encouraged to ask questions and all questions were answered to her satisfaction.  She was given my business card and encouraged to contact me with any concerns regarding survivorship.  I look forward to participating in her care.   Kenn File, [place] [phone]

## 2015-11-14 ENCOUNTER — Telehealth: Payer: Self-pay | Admitting: *Deleted

## 2015-11-14 ENCOUNTER — Ambulatory Visit
Admission: RE | Admit: 2015-11-14 | Discharge: 2015-11-14 | Disposition: A | Discharge status: Home / Self Care | Payer: BLUE CROSS/BLUE SHIELD | Source: Ambulatory Visit | Attending: General Surgery | Admitting: General Surgery

## 2015-11-14 DIAGNOSIS — C50411 Malignant neoplasm of upper-outer quadrant of right female breast: Secondary | ICD-10-CM

## 2015-11-14 MED ORDER — GADOBENATE DIMEGLUMINE 529 MG/ML IV SOLN
20.0000 mL | Freq: Once | INTRAVENOUS | Status: AC | PRN
  Administered 2015-11-14: 20 mL via INTRAVENOUS

## 2015-11-14 NOTE — Telephone Encounter (Signed)
Received message stating patient would like to get scans done tomorrow as her insurance is changing on Monday and she would have to pay more.  I was able to get scans scheduled for tomorrow at [facility].  Patient is to arrive at 845am at [facility].  She will come by the cancer center today and pick up her contrast.  Also informed her nothing to eat or drink 4 hours prior to scan and she should drink one bottle of contrast at 730am and the second one at 830am.  Patient verbalized understanding.

## 2015-11-15 ENCOUNTER — Other Ambulatory Visit: Payer: Self-pay | Admitting: General Surgery

## 2015-11-15 ENCOUNTER — Encounter (HOSPITAL_COMMUNITY): Payer: Self-pay

## 2015-11-15 ENCOUNTER — Ambulatory Visit (HOSPITAL_COMMUNITY)
Admission: RE | Admit: 2015-11-15 | Discharge: 2015-11-15 | Disposition: A | Discharge status: Home / Self Care | Payer: BLUE CROSS/BLUE SHIELD | Source: Ambulatory Visit | Attending: Hematology and Oncology | Admitting: Hematology and Oncology

## 2015-11-15 ENCOUNTER — Encounter (HOSPITAL_COMMUNITY)
Admission: RE | Admit: 2015-11-15 | Discharge: 2015-11-15 | Disposition: A | Discharge status: Home / Self Care | Payer: BLUE CROSS/BLUE SHIELD | Source: Ambulatory Visit | Attending: Hematology and Oncology | Admitting: Hematology and Oncology

## 2015-11-15 DIAGNOSIS — R599 Enlarged lymph nodes, unspecified: Secondary | ICD-10-CM | POA: Insufficient documentation

## 2015-11-15 DIAGNOSIS — K766 Portal hypertension: Secondary | ICD-10-CM | POA: Diagnosis not present

## 2015-11-15 DIAGNOSIS — R911 Solitary pulmonary nodule: Secondary | ICD-10-CM | POA: Insufficient documentation

## 2015-11-15 DIAGNOSIS — K7460 Unspecified cirrhosis of liver: Secondary | ICD-10-CM | POA: Insufficient documentation

## 2015-11-15 DIAGNOSIS — R928 Other abnormal and inconclusive findings on diagnostic imaging of breast: Secondary | ICD-10-CM

## 2015-11-15 DIAGNOSIS — R161 Splenomegaly, not elsewhere classified: Secondary | ICD-10-CM | POA: Insufficient documentation

## 2015-11-15 DIAGNOSIS — C50411 Malignant neoplasm of upper-outer quadrant of right female breast: Secondary | ICD-10-CM | POA: Insufficient documentation

## 2015-11-15 DIAGNOSIS — Z853 Personal history of malignant neoplasm of breast: Secondary | ICD-10-CM

## 2015-11-15 DIAGNOSIS — I864 Gastric varices: Secondary | ICD-10-CM | POA: Diagnosis not present

## 2015-11-15 MED ORDER — TECHNETIUM TC 99M MEDRONATE IV KIT
25.0000 | PACK | Freq: Once | INTRAVENOUS | Status: AC | PRN
  Administered 2015-11-15: 25 via INTRAVENOUS

## 2015-11-15 MED ORDER — IOPAMIDOL (ISOVUE-300) INJECTION 61%
INTRAVENOUS | Status: AC
  Administered 2015-11-15: 100 mL
  Filled 2015-11-15: qty 100

## 2015-11-18 ENCOUNTER — Encounter: Payer: Self-pay | Admitting: *Deleted

## 2015-11-19 ENCOUNTER — Ambulatory Visit
Admission: RE | Admit: 2015-11-19 | Discharge: 2015-11-19 | Disposition: A | Discharge status: Home / Self Care | Payer: BLUE CROSS/BLUE SHIELD | Source: Ambulatory Visit | Attending: General Surgery | Admitting: General Surgery

## 2015-11-19 ENCOUNTER — Ambulatory Visit
Admission: RE | Admit: 2015-11-19 | Discharge: 2015-11-19 | Disposition: A | Discharge status: Home / Self Care | Payer: Managed Care, Other (non HMO) | Source: Ambulatory Visit | Attending: General Surgery | Admitting: General Surgery

## 2015-11-19 DIAGNOSIS — Z853 Personal history of malignant neoplasm of breast: Secondary | ICD-10-CM

## 2015-11-19 MED ORDER — GADOBENATE DIMEGLUMINE 529 MG/ML IV SOLN
20.0000 mL | Freq: Once | INTRAVENOUS | Status: AC | PRN
  Administered 2015-11-19: 20 mL via INTRAVENOUS

## 2015-11-21 ENCOUNTER — Other Ambulatory Visit (HOSPITAL_BASED_OUTPATIENT_CLINIC_OR_DEPARTMENT_OTHER): Payer: Managed Care, Other (non HMO)

## 2015-11-21 ENCOUNTER — Other Ambulatory Visit: Payer: Self-pay | Admitting: *Deleted

## 2015-11-21 DIAGNOSIS — Z01812 Encounter for preprocedural laboratory examination: Secondary | ICD-10-CM

## 2015-11-21 DIAGNOSIS — C50411 Malignant neoplasm of upper-outer quadrant of right female breast: Secondary | ICD-10-CM

## 2015-11-21 LAB — CBC WITH DIFFERENTIAL/PLATELET
BASO%: 0.3 % (ref 0.0–2.0)
BASOS ABS: 0 10*3/uL (ref 0.0–0.1)
EOS%: 2 % (ref 0.0–7.0)
Eosinophils Absolute: 0.1 10*3/uL (ref 0.0–0.5)
HEMATOCRIT: 43.1 % (ref 34.8–46.6)
HEMOGLOBIN: 14.3 g/dL (ref 11.6–15.9)
LYMPH#: 1.6 10*3/uL (ref 0.9–3.3)
LYMPH%: 23.4 % (ref 14.0–49.7)
MCH: 32.6 pg (ref 25.1–34.0)
MCHC: 33.3 g/dL (ref 31.5–36.0)
MCV: 97.9 fL (ref 79.5–101.0)
MONO#: 1 10*3/uL — ABNORMAL HIGH (ref 0.1–0.9)
MONO%: 14.1 % — ABNORMAL HIGH (ref 0.0–14.0)
NEUT%: 60.2 % (ref 38.4–76.8)
NEUTROS ABS: 4.1 10*3/uL (ref 1.5–6.5)
Platelets: 114 10*3/uL — ABNORMAL LOW (ref 145–400)
RBC: 4.4 10*6/uL (ref 3.70–5.45)
RDW: 13.3 % (ref 11.2–14.5)
WBC: 6.9 10*3/uL (ref 3.9–10.3)

## 2015-11-21 LAB — COMPREHENSIVE METABOLIC PANEL
ALBUMIN: 3.7 g/dL (ref 3.5–5.0)
ALK PHOS: 95 U/L (ref 40–150)
ALT: 68 U/L — AB (ref 0–55)
AST: 114 U/L — AB (ref 5–34)
Anion Gap: 8 mEq/L (ref 3–11)
BILIRUBIN TOTAL: 1.47 mg/dL — AB (ref 0.20–1.20)
BUN: 9.9 mg/dL (ref 7.0–26.0)
CALCIUM: 9.3 mg/dL (ref 8.4–10.4)
CO2: 26 mEq/L (ref 22–29)
CREATININE: 0.7 mg/dL (ref 0.6–1.1)
Chloride: 104 mEq/L (ref 98–109)
EGFR: >90 mL/min/{1.73_m2} (ref >90)
Glucose: 115 mg/dl (ref 70–140)
Potassium: 3.9 mEq/L (ref 3.5–5.1)
Sodium: 138 mEq/L (ref 136–145)
TOTAL PROTEIN: 7.9 g/dL (ref 6.4–8.3)

## 2015-11-21 LAB — PROTIME-INR
INR: 1.3 — AB (ref 2.00–3.50)
Protime: 15.6 Seconds — ABNORMAL HIGH (ref 10.6–13.4)

## 2015-11-22 ENCOUNTER — Encounter: Payer: Self-pay | Admitting: Hematology and Oncology

## 2015-11-22 LAB — PTT FACTOR INHIBITOR (MIXING STUDY)
APTT: 29.3 s (ref 22.9–30.2)
aPTT 1:1 Mix Saline: 40.9 s
aPTT 1:1 Normal Plasma: 27.2 s (ref 22.9–30.2)

## 2015-12-02 ENCOUNTER — Telehealth: Payer: Self-pay | Admitting: *Deleted

## 2015-12-02 NOTE — Telephone Encounter (Signed)
Received call from patient asking when her appointment is with Dr. Dalbert Batman.  She has seen Dr. Marla Roe and had her endoscopy.  Informed her that it was 4/25.  She is requesting if this could be moved up.  Informed her I would check with his office to see and have them call her.

## 2015-12-03 ENCOUNTER — Other Ambulatory Visit: Payer: Self-pay | Admitting: General Surgery

## 2015-12-03 DIAGNOSIS — C50111 Malignant neoplasm of central portion of right female breast: Secondary | ICD-10-CM

## 2015-12-20 ENCOUNTER — Encounter: Payer: Self-pay | Admitting: *Deleted

## 2015-12-20 ENCOUNTER — Telehealth: Payer: Self-pay | Admitting: Hematology and Oncology

## 2015-12-20 NOTE — Telephone Encounter (Signed)
Spoke with patient to confirm 6/1 appt on 11:15 am

## 2016-01-02 ENCOUNTER — Encounter (HOSPITAL_COMMUNITY): Payer: Self-pay

## 2016-01-02 ENCOUNTER — Encounter (HOSPITAL_COMMUNITY)
Admission: RE | Admit: 2016-01-02 | Discharge: 2016-01-02 | Disposition: A | Discharge status: Home / Self Care | Payer: Managed Care, Other (non HMO) | Source: Ambulatory Visit | Attending: General Surgery | Admitting: General Surgery

## 2016-01-02 ENCOUNTER — Other Ambulatory Visit: Payer: Self-pay

## 2016-01-02 DIAGNOSIS — C50811 Malignant neoplasm of overlapping sites of right female breast: Secondary | ICD-10-CM | POA: Diagnosis not present

## 2016-01-02 DIAGNOSIS — I10 Essential (primary) hypertension: Secondary | ICD-10-CM | POA: Diagnosis not present

## 2016-01-02 DIAGNOSIS — Z01818 Encounter for other preprocedural examination: Secondary | ICD-10-CM | POA: Insufficient documentation

## 2016-01-02 DIAGNOSIS — Z01812 Encounter for preprocedural laboratory examination: Secondary | ICD-10-CM | POA: Insufficient documentation

## 2016-01-02 LAB — COMPREHENSIVE METABOLIC PANEL
ALT: 46 U/L (ref 14–54)
ANION GAP: 8 (ref 5–15)
AST: 67 U/L — ABNORMAL HIGH (ref 15–41)
Albumin: 3.6 g/dL (ref 3.5–5.0)
Alkaline Phosphatase: 83 U/L (ref 38–126)
BUN: 12 mg/dL (ref 6–20)
CHLORIDE: 104 mmol/L (ref 101–111)
CO2: 28 mmol/L (ref 22–32)
Calcium: 9.5 mg/dL (ref 8.9–10.3)
Creatinine, Ser: 0.59 mg/dL (ref 0.44–1.00)
GFR calc non Af Amer: >60 mL/min (ref >60)
Glucose, Bld: 134 mg/dL — ABNORMAL HIGH (ref 65–99)
POTASSIUM: 4 mmol/L (ref 3.5–5.1)
SODIUM: 140 mmol/L (ref 135–145)
Total Bilirubin: 1.2 mg/dL (ref 0.3–1.2)
Total Protein: 7.6 g/dL (ref 6.5–8.1)

## 2016-01-02 LAB — CBC WITH DIFFERENTIAL/PLATELET
Basophils Absolute: 0.1 10*3/uL (ref 0.0–0.1)
Basophils Relative: 1 %
EOS ABS: 0.2 10*3/uL (ref 0.0–0.7)
EOS PCT: 3 %
HCT: 39.6 % (ref 36.0–46.0)
Hemoglobin: 13.6 g/dL (ref 12.0–15.0)
LYMPHS ABS: 2 10*3/uL (ref 0.7–4.0)
Lymphocytes Relative: 29 %
MCH: 31.8 pg (ref 26.0–34.0)
MCHC: 34.3 g/dL (ref 30.0–36.0)
MCV: 92.5 fL (ref 78.0–100.0)
Monocytes Absolute: 0.8 10*3/uL (ref 0.1–1.0)
Monocytes Relative: 11 %
Neutro Abs: 3.8 10*3/uL (ref 1.7–7.7)
Neutrophils Relative %: 56 %
PLATELETS: 134 10*3/uL — AB (ref 150–400)
RBC: 4.28 MIL/uL (ref 3.87–5.11)
RDW: 12.7 % (ref 11.5–15.5)
WBC: 6.8 10*3/uL (ref 4.0–10.5)

## 2016-01-02 LAB — PROTIME-INR
INR: 1.4 (ref 0.00–1.49)
Prothrombin Time: 17.2 seconds — ABNORMAL HIGH (ref 11.6–15.2)

## 2016-01-02 LAB — APTT: aPTT: 34 seconds (ref 24–37)

## 2016-01-02 NOTE — Pre-Procedure Instructions (Signed)
    [name]  01/02/2016      [facility] [place], [place] [address] [place] [address] Phone: [phone] Fax: [phone]    Your procedure is scheduled on 01/09/16.  Report to [facility] Admitting at 8 A.M.  Call this number if you have problems the morning of surgery:  [phone]   Remember:  Do not eat food or drink liquids after midnight.  Take these medicines the morning of surgery with A SIP OF WATER --celexa,ativan   Do not wear jewelry, make-up or nail polish.  Do not wear lotions, powders, or perfumes.  You may wear deodorant.  Do not shave 48 hours prior to surgery.  Men may shave face and neck.  Do not bring valuables to the hospital.  [facility] is not responsible for any belongings or valuables.  Contacts, dentures or bridgework may not be worn into surgery.  Leave your suitcase in the car.  After surgery it may be brought to your room.  For patients admitted to the hospital, discharge time will be determined by your treatment team.  Patients discharged the day of surgery will not be allowed to drive home.   Name and phone number of your drive  Please read over the following fact sheets that you were given.

## 2016-01-06 NOTE — H&P (Signed)
[name]  Location: [facility] Surgery Patient #: [id number] DOB: [birth year] Married / Language: English / Race: White Female      History of Present Illness     This is a 50 year old female who returns with her husband. This is her third visit to discuss management of her multicentric right breast cancer and she is ready to go ahead and schedule surgery.   She was initially seen in the [facility] by Dr. Sondra Come, Dr. Lindi Adie, and me. Dr. Shirline Frees is her PCP.       she noted a palpable mass in the right breast above the areolar margin. Imaging studies showed very dense breast. Ultrasound showed a 2.7 cm mass in the right breast upper outer quadrant and a normal axilla. Image guided biopsy showed grade 2 invasive carcinoma right breast, receptor positive, HER-2/neu negative, Ki-67 7%. Subsequent MRI showed a mass in the right breast upper inner quadrant which was also biopsied and also showed invasive duct carcinoma. She had a left breast mass which was biopsied and showed benign fibrocystic changes.      She therefore has multicentric invasive breast cancer, upper outer and upper inner.      Dr. Lindi Adie has stated that he will decide about chemotherapy after the final pathology is him and he gets this result.      Comorbidities include BMI 35, hypertension, liver disease, daily alcohol use. She has now stopped drinking. Psoriatic arthritis. Abdominal hysterectomy for fibroids but ovaries remain in place.      Her staging scans showed no metastatic disease, but because her staging scans showed cirrhosis, portal hypertension, gastric varices, and splenomegaly, she was referred to Dr. Benson Norway of gastroenterology. He performed an upper endoscopy and saw a large gastric varix. He thinks she is a Child's A cirrhotic. Platelet count 114,000. He cleared her to go ahead with surgery. She has seen Dr. Marla Roe of plastic surgery. After lengthy discussion and decision making she has  decided she wants immediate implant-based reconstruction. I think that is reasonable. She knows that she may need a contralateral reduction at some point down the line. She knows that there is no indication for prophylactic contralateral mastectomy in her case.      She will be scheduled for right total mastectomy, right axillary sentinel node biopsy, and immediate implant-based reconstruction. I discussed indications, details, techniques, and numerous risk of the cancer surgery. She is aware of the risk of bleeding, infection, arm swelling, arm numbness, nerve damage with chronic pain and numbness, shoulder disability and other unforeseen problems. She has that she may or may not need radiation therapy. She knows that she may or may not need chemotherapy. All of her questions are answered. She understands all these issues. She agrees with this plan.   Allergies  Xanax XR *ANTIANXIETY AGENTS* Adhesive Tape  Medication History  LORazepam (0.5MG Tablet, Oral) Active. Multi Vitamin (Oral) Active. Hyzaar (50-12.5MG Tablet, Oral) Active. Citalopram Hydrobromide (10MG Tablet, Oral) Active. Medications Reconciled  Vitals  Weight: 219 lb Height: 66in Body Surface Area: 2.08 m Body Mass Index: 35.35 kg/m  Temp.: 97.95F  Pulse: 76 (Regular)  BP: 140/80 (Sitting, Left Arm, Standard)    Physical Exam  General Mental Status-Alert. General Appearance-Not in acute distress. Build & Nutrition-Well nourished. Posture-Normal posture. Gait-Normal.  Head and Neck Head-normocephalic, atraumatic with no lesions or palpable masses. Trachea-midline. Thyroid Gland Characteristics - normal size and consistency and no palpable nodules.  Chest and Lung Exam Chest and lung exam reveals -on auscultation,  normal breath sounds, no adventitious sounds and normal vocal resonance.  Breast Note: Breast medium size. Moderate ptosis. Palpable mass at least 2.5 cm  above the right areolar margin. No obvious skin involvement. No palpable mass of the axilla. Bruising in the right breast is subsiding psoriatic arthritis plaque at 4 o'clock position right breast skin. Left breast not examined today. Please see prior notes.   Cardiovascular Cardiovascular examination reveals -normal heart sounds, regular rate and rhythm with no murmurs and femoral artery auscultation bilaterally reveals normal pulses, no bruits, no thrills.  Abdomen Inspection Inspection of the abdomen reveals - No Hernias. Palpation/Percussion Palpation and Percussion of the abdomen reveal - Soft, Non Tender, No Rigidity (guarding), No hepatosplenomegaly and No Palpable abdominal masses. Note: Lower midline abdominal scar. I don't really feel the liver edge.   Neurologic Neurologic evaluation reveals -alert and oriented x 3 with no impairment of recent or remote memory, normal attention span and ability to concentrate, normal sensation and normal coordination.  Musculoskeletal Normal Exam - Bilateral-Upper Extremity Strength Normal and Lower Extremity Strength Normal.    Assessment & Plan  CANCER OF CENTRAL PORTION OF RIGHT FEMALE BREAST (C50.111)   You have discussed plastic surgical options with Dr. Marla Roe, and we have gone over this today in detail You have decided to have immediate implant-based reconstruction with tissue expander  You have seen Dr. Carol Ada regarding your liver disease. He states that we can proceed with surgery and he will see you back in 3 months You have stopped all alcohol intake, and I strongly support that decision forever You should also not take Tylenol because of your liver disease.  We discussed all of her options once again. 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

## 2016-01-07 ENCOUNTER — Other Ambulatory Visit: Payer: Self-pay | Admitting: Plastic Surgery

## 2016-01-07 DIAGNOSIS — C50911 Malignant neoplasm of unspecified site of right female breast: Secondary | ICD-10-CM

## 2016-01-09 ENCOUNTER — Encounter (HOSPITAL_COMMUNITY)
Admission: RE | Disposition: A | Discharge status: Home / Self Care | Payer: Self-pay | Source: Ambulatory Visit | Attending: General Surgery

## 2016-01-09 ENCOUNTER — Ambulatory Visit (HOSPITAL_COMMUNITY): Payer: Managed Care, Other (non HMO) | Admitting: Certified Registered Nurse Anesthetist

## 2016-01-09 ENCOUNTER — Encounter (HOSPITAL_COMMUNITY): Payer: Self-pay | Admitting: Plastic Surgery

## 2016-01-09 ENCOUNTER — Encounter (HOSPITAL_COMMUNITY)
Admission: RE | Admit: 2016-01-09 | Discharge: 2016-01-09 | Disposition: A | Discharge status: Home / Self Care | Payer: Managed Care, Other (non HMO) | Source: Ambulatory Visit | Attending: General Surgery | Admitting: General Surgery

## 2016-01-09 ENCOUNTER — Observation Stay (HOSPITAL_COMMUNITY)
Admission: RE | Admit: 2016-01-09 | Discharge: 2016-01-11 | Disposition: A | Discharge status: Home / Self Care | Payer: Managed Care, Other (non HMO) | Source: Ambulatory Visit | Attending: General Surgery | Admitting: General Surgery

## 2016-01-09 DIAGNOSIS — C50111 Malignant neoplasm of central portion of right female breast: Principal | ICD-10-CM | POA: Insufficient documentation

## 2016-01-09 DIAGNOSIS — R42 Dizziness and giddiness: Secondary | ICD-10-CM | POA: Insufficient documentation

## 2016-01-09 DIAGNOSIS — C50919 Malignant neoplasm of unspecified site of unspecified female breast: Secondary | ICD-10-CM | POA: Diagnosis present

## 2016-01-09 DIAGNOSIS — Z90710 Acquired absence of both cervix and uterus: Secondary | ICD-10-CM | POA: Insufficient documentation

## 2016-01-09 DIAGNOSIS — I10 Essential (primary) hypertension: Secondary | ICD-10-CM | POA: Diagnosis not present

## 2016-01-09 DIAGNOSIS — L4050 Arthropathic psoriasis, unspecified: Secondary | ICD-10-CM | POA: Diagnosis not present

## 2016-01-09 DIAGNOSIS — K7469 Other cirrhosis of liver: Secondary | ICD-10-CM | POA: Diagnosis not present

## 2016-01-09 DIAGNOSIS — R040 Epistaxis: Secondary | ICD-10-CM | POA: Insufficient documentation

## 2016-01-09 DIAGNOSIS — C50911 Malignant neoplasm of unspecified site of right female breast: Secondary | ICD-10-CM

## 2016-01-09 HISTORY — PX: BREAST RECONSTRUCTION WITH PLACEMENT OF TISSUE EXPANDER AND FLEX HD (ACELLULAR HYDRATED DERMIS): SHX6295

## 2016-01-09 HISTORY — PX: SIMPLE MASTECTOMY WITH AXILLARY SENTINEL NODE BIOPSY: SHX6098

## 2016-01-09 LAB — BASIC METABOLIC PANEL
ANION GAP: 10 (ref 5–15)
BUN: 12 mg/dL (ref 6–20)
CHLORIDE: 104 mmol/L (ref 101–111)
CO2: 23 mmol/L (ref 22–32)
Calcium: 8.8 mg/dL — ABNORMAL LOW (ref 8.9–10.3)
Creatinine, Ser: 0.56 mg/dL (ref 0.44–1.00)
GFR calc Af Amer: >60 mL/min (ref >60)
GLUCOSE: 141 mg/dL — AB (ref 65–99)
POTASSIUM: 3.2 mmol/L — AB (ref 3.5–5.1)
Sodium: 137 mmol/L (ref 135–145)

## 2016-01-09 LAB — CBC
HEMATOCRIT: 36.8 % (ref 36.0–46.0)
HEMOGLOBIN: 12.8 g/dL (ref 12.0–15.0)
MCH: 32 pg (ref 26.0–34.0)
MCHC: 34.8 g/dL (ref 30.0–36.0)
MCV: 92 fL (ref 78.0–100.0)
Platelets: 122 10*3/uL — ABNORMAL LOW (ref 150–400)
RBC: 4 MIL/uL (ref 3.87–5.11)
RDW: 12.6 % (ref 11.5–15.5)
WBC: 5.8 10*3/uL (ref 4.0–10.5)

## 2016-01-09 SURGERY — SIMPLE MASTECTOMY WITH AXILLARY SENTINEL NODE BIOPSY
Anesthesia: General | Site: Breast | Laterality: Right

## 2016-01-09 MED ORDER — SUCCINYLCHOLINE CHLORIDE 200 MG/10ML IV SOSY
PREFILLED_SYRINGE | INTRAVENOUS | Status: AC
  Filled 2016-01-09: qty 20

## 2016-01-09 MED ORDER — OXYCODONE HCL ER 10 MG PO T12A
10.0000 mg | EXTENDED_RELEASE_TABLET | Freq: Two times a day (BID) | ORAL | Status: DC
  Administered 2016-01-09 – 2016-01-11 (×5): 10 mg via ORAL
  Filled 2016-01-09 (×5): qty 1

## 2016-01-09 MED ORDER — TECHNETIUM TC 99M SULFUR COLLOID FILTERED
1.0000 | Freq: Once | INTRAVENOUS | Status: AC | PRN
  Administered 2016-01-09: 1 via INTRADERMAL

## 2016-01-09 MED ORDER — PROPOFOL 10 MG/ML IV BOLUS
INTRAVENOUS | Status: DC | PRN
  Administered 2016-01-09: 200 mg via INTRAVENOUS

## 2016-01-09 MED ORDER — ONDANSETRON HCL 4 MG/2ML IJ SOLN
INTRAMUSCULAR | Status: DC | PRN
  Administered 2016-01-09: 4 mg via INTRAVENOUS

## 2016-01-09 MED ORDER — DIPHENHYDRAMINE HCL 50 MG/ML IJ SOLN: 12.5000 mg | Freq: Four times a day (QID) | INTRAMUSCULAR | Status: DC | PRN

## 2016-01-09 MED ORDER — MIDAZOLAM HCL 2 MG/2ML IJ SOLN
INTRAMUSCULAR | Status: AC
  Filled 2016-01-09: qty 2

## 2016-01-09 MED ORDER — MIDAZOLAM HCL 2 MG/2ML IJ SOLN
2.0000 mg | Freq: Once | INTRAMUSCULAR | Status: AC
  Administered 2016-01-09: 2 mg via INTRAVENOUS

## 2016-01-09 MED ORDER — EPHEDRINE SULFATE 50 MG/ML IJ SOLN
INTRAMUSCULAR | Status: DC | PRN
  Administered 2016-01-09 (×3): 5 mg via INTRAVENOUS

## 2016-01-09 MED ORDER — SENNA 8.6 MG PO TABS
1.0000 | ORAL_TABLET | Freq: Two times a day (BID) | ORAL | Status: DC
  Administered 2016-01-09 – 2016-01-11 (×5): 8.6 mg via ORAL
  Filled 2016-01-09 (×5): qty 1

## 2016-01-09 MED ORDER — SODIUM CHLORIDE 0.9 % IR SOLN
Status: DC | PRN
  Administered 2016-01-09: 500 mL

## 2016-01-09 MED ORDER — LACTATED RINGERS IV SOLN
INTRAVENOUS | Status: DC | PRN
  Administered 2016-01-09 (×2): via INTRAVENOUS

## 2016-01-09 MED ORDER — LACTATED RINGERS IV SOLN
Freq: Once | INTRAVENOUS | Status: AC
  Administered 2016-01-09: 09:00:00 via INTRAVENOUS

## 2016-01-09 MED ORDER — METOCLOPRAMIDE HCL 5 MG/ML IJ SOLN
INTRAMUSCULAR | Status: AC
  Filled 2016-01-09: qty 2

## 2016-01-09 MED ORDER — OXYCODONE HCL 5 MG PO TABS
5.0000 mg | ORAL_TABLET | Freq: Four times a day (QID) | ORAL | Status: DC | PRN
  Administered 2016-01-10 – 2016-01-11 (×4): 5 mg via ORAL
  Filled 2016-01-09 (×4): qty 1

## 2016-01-09 MED ORDER — LOSARTAN POTASSIUM 50 MG PO TABS
100.0000 mg | ORAL_TABLET | Freq: Every day | ORAL | Status: DC
  Administered 2016-01-09 – 2016-01-11 (×2): 100 mg via ORAL
  Filled 2016-01-09 (×2): qty 2

## 2016-01-09 MED ORDER — ROCURONIUM BROMIDE 100 MG/10ML IV SOLN
INTRAVENOUS | Status: DC | PRN
  Administered 2016-01-09: 50 mg via INTRAVENOUS

## 2016-01-09 MED ORDER — DEXAMETHASONE SODIUM PHOSPHATE 10 MG/ML IJ SOLN
INTRAMUSCULAR | Status: AC
  Filled 2016-01-09: qty 1

## 2016-01-09 MED ORDER — MIDAZOLAM HCL 2 MG/2ML IJ SOLN
INTRAMUSCULAR | Status: AC
  Administered 2016-01-09: 2 mg via INTRAVENOUS
  Filled 2016-01-09: qty 2

## 2016-01-09 MED ORDER — HYDROMORPHONE HCL 1 MG/ML IJ SOLN: 0.2500 mg | INTRAMUSCULAR | Status: DC | PRN

## 2016-01-09 MED ORDER — CEFAZOLIN SODIUM-DEXTROSE 2-4 GM/100ML-% IV SOLN
2.0000 g | INTRAVENOUS | Status: DC
Start: 2016-01-09 — End: 2016-01-09

## 2016-01-09 MED ORDER — ONDANSETRON HCL 4 MG/2ML IJ SOLN
INTRAMUSCULAR | Status: AC
  Filled 2016-01-09: qty 2

## 2016-01-09 MED ORDER — PROPOFOL 10 MG/ML IV BOLUS
INTRAVENOUS | Status: AC
  Filled 2016-01-09: qty 20

## 2016-01-09 MED ORDER — ARTIFICIAL TEARS OP OINT
TOPICAL_OINTMENT | OPHTHALMIC | Status: DC | PRN
Start: 2016-01-09 — End: 2016-01-09
  Administered 2016-01-09: 1 via OPHTHALMIC

## 2016-01-09 MED ORDER — SODIUM CHLORIDE 0.9 % IR SOLN
Status: DC | PRN
  Administered 2016-01-09: 1000 mL

## 2016-01-09 MED ORDER — BUPIVACAINE HCL (PF) 0.5 % IJ SOLN
INTRAMUSCULAR | Status: DC | PRN
  Administered 2016-01-09: 30 mL via PERINEURAL

## 2016-01-09 MED ORDER — ONDANSETRON HCL 4 MG/2ML IJ SOLN: 4.0000 mg | Freq: Four times a day (QID) | INTRAMUSCULAR | Status: DC | PRN

## 2016-01-09 MED ORDER — BISACODYL 10 MG RE SUPP: 10.0000 mg | Freq: Every day | RECTAL | Status: DC | PRN

## 2016-01-09 MED ORDER — ONDANSETRON 4 MG PO TBDP: 4.0000 mg | ORAL_TABLET | Freq: Four times a day (QID) | ORAL | Status: DC | PRN

## 2016-01-09 MED ORDER — GLYCOPYRROLATE 0.2 MG/ML IJ SOLN
INTRAMUSCULAR | Status: DC | PRN
  Administered 2016-01-09: 0.6 mg via INTRAVENOUS

## 2016-01-09 MED ORDER — FENTANYL CITRATE (PF) 250 MCG/5ML IJ SOLN
INTRAMUSCULAR | Status: DC | PRN
  Administered 2016-01-09: 50 ug via INTRAVENOUS
  Administered 2016-01-09 (×2): 100 ug via INTRAVENOUS

## 2016-01-09 MED ORDER — ADULT MULTIVITAMIN W/MINERALS CH
1.0000 | ORAL_TABLET | Freq: Every day | ORAL | Status: DC
  Administered 2016-01-09 – 2016-01-11 (×3): 1 via ORAL
  Filled 2016-01-09 (×4): qty 1

## 2016-01-09 MED ORDER — CHLORHEXIDINE GLUCONATE 4 % EX LIQD: 1.0000 "application " | Freq: Once | CUTANEOUS | Status: DC

## 2016-01-09 MED ORDER — KCL IN DEXTROSE-NACL 20-5-0.45 MEQ/L-%-% IV SOLN
INTRAVENOUS | Status: DC
  Administered 2016-01-09 – 2016-01-10 (×3): via INTRAVENOUS
  Filled 2016-01-09 (×4): qty 1000

## 2016-01-09 MED ORDER — KETOROLAC TROMETHAMINE 30 MG/ML IJ SOLN
30.0000 mg | Freq: Once | INTRAMUSCULAR | Status: AC | PRN
Start: 2016-01-09 — End: 2016-01-09
  Administered 2016-01-09: 30 mg via INTRAVENOUS

## 2016-01-09 MED ORDER — PROCHLORPERAZINE EDISYLATE 5 MG/ML IJ SOLN: 10.0000 mg | Freq: Once | INTRAMUSCULAR | Status: DC

## 2016-01-09 MED ORDER — LIDOCAINE HCL (CARDIAC) 20 MG/ML IV SOLN
INTRAVENOUS | Status: DC | PRN
  Administered 2016-01-09: 80 mg via INTRAVENOUS

## 2016-01-09 MED ORDER — LOSARTAN POTASSIUM-HCTZ 100-25 MG PO TABS: 1.0000 | ORAL_TABLET | Freq: Every day | ORAL | Status: DC

## 2016-01-09 MED ORDER — GLYCOPYRROLATE 0.2 MG/ML IV SOSY
PREFILLED_SYRINGE | INTRAVENOUS | Status: AC
  Filled 2016-01-09: qty 12

## 2016-01-09 MED ORDER — METHOCARBAMOL 500 MG PO TABS: 500.0000 mg | ORAL_TABLET | Freq: Four times a day (QID) | ORAL | Status: DC | PRN

## 2016-01-09 MED ORDER — SODIUM CHLORIDE 0.9 % IJ SOLN
INTRAMUSCULAR | Status: AC
  Filled 2016-01-09: qty 10

## 2016-01-09 MED ORDER — FENTANYL CITRATE (PF) 100 MCG/2ML IJ SOLN
100.0000 ug | Freq: Once | INTRAMUSCULAR | Status: AC
  Administered 2016-01-09: 100 ug via INTRAVENOUS

## 2016-01-09 MED ORDER — HYDROMORPHONE HCL 1 MG/ML IJ SOLN
1.0000 mg | INTRAMUSCULAR | Status: DC | PRN
  Administered 2016-01-09: 1 mg via INTRAVENOUS
  Filled 2016-01-09: qty 1

## 2016-01-09 MED ORDER — DEXAMETHASONE SODIUM PHOSPHATE 10 MG/ML IJ SOLN
INTRAMUSCULAR | Status: DC | PRN
  Administered 2016-01-09: 10 mg via INTRAVENOUS

## 2016-01-09 MED ORDER — ROCURONIUM BROMIDE 50 MG/5ML IV SOLN
INTRAVENOUS | Status: AC
  Filled 2016-01-09: qty 1

## 2016-01-09 MED ORDER — NEOSTIGMINE METHYLSULFATE 5 MG/5ML IV SOSY
PREFILLED_SYRINGE | INTRAVENOUS | Status: AC
  Filled 2016-01-09: qty 10

## 2016-01-09 MED ORDER — 0.9 % SODIUM CHLORIDE (POUR BTL) OPTIME
TOPICAL | Status: DC | PRN
  Administered 2016-01-09 (×3): 1000 mL

## 2016-01-09 MED ORDER — POLYETHYLENE GLYCOL 3350 17 G PO PACK: 17.0000 g | PACK | Freq: Every day | ORAL | Status: DC | PRN

## 2016-01-09 MED ORDER — HYDROCHLOROTHIAZIDE 25 MG PO TABS
25.0000 mg | ORAL_TABLET | Freq: Every day | ORAL | Status: DC
  Administered 2016-01-09 – 2016-01-11 (×2): 25 mg via ORAL
  Filled 2016-01-09 (×2): qty 1

## 2016-01-09 MED ORDER — SODIUM CHLORIDE 0.9 % IJ SOLN
INTRAVENOUS | Status: DC | PRN
  Administered 2016-01-09: 10:00:00

## 2016-01-09 MED ORDER — SUCCINYLCHOLINE CHLORIDE 20 MG/ML IJ SOLN
INTRAMUSCULAR | Status: DC | PRN
  Administered 2016-01-09: 160 mg via INTRAVENOUS

## 2016-01-09 MED ORDER — FENTANYL CITRATE (PF) 100 MCG/2ML IJ SOLN
INTRAMUSCULAR | Status: AC
  Administered 2016-01-09: 100 ug via INTRAVENOUS
  Filled 2016-01-09: qty 2

## 2016-01-09 MED ORDER — METHYLENE BLUE 0.5 % INJ SOLN
INTRAVENOUS | Status: AC
  Filled 2016-01-09: qty 10

## 2016-01-09 MED ORDER — DIPHENHYDRAMINE HCL 12.5 MG/5ML PO ELIX: 12.5000 mg | ORAL_SOLUTION | Freq: Four times a day (QID) | ORAL | Status: DC | PRN

## 2016-01-09 MED ORDER — MIDAZOLAM HCL 2 MG/2ML IJ SOLN
INTRAMUSCULAR | Status: DC | PRN
  Administered 2016-01-09 (×2): 1 mg via INTRAVENOUS

## 2016-01-09 MED ORDER — NEOSTIGMINE METHYLSULFATE 10 MG/10ML IV SOLN
INTRAVENOUS | Status: DC | PRN
  Administered 2016-01-09: 4 mg via INTRAVENOUS

## 2016-01-09 MED ORDER — CEFAZOLIN SODIUM-DEXTROSE 2-4 GM/100ML-% IV SOLN
2.0000 g | Freq: Three times a day (TID) | INTRAVENOUS | Status: DC
  Administered 2016-01-09 – 2016-01-11 (×6): 2 g via INTRAVENOUS
  Filled 2016-01-09 (×9): qty 100

## 2016-01-09 MED ORDER — ACETAMINOPHEN 10 MG/ML IV SOLN
1000.0000 mg | INTRAVENOUS | Status: AC
  Administered 2016-01-09: 1000 mg via INTRAVENOUS
  Filled 2016-01-09: qty 100

## 2016-01-09 MED ORDER — KETOROLAC TROMETHAMINE 30 MG/ML IJ SOLN
INTRAMUSCULAR | Status: AC
  Filled 2016-01-09: qty 1

## 2016-01-09 MED ORDER — CEFAZOLIN SODIUM-DEXTROSE 2-4 GM/100ML-% IV SOLN
2.0000 g | INTRAVENOUS | Status: AC
  Administered 2016-01-09: 2 g via INTRAVENOUS
  Filled 2016-01-09: qty 100

## 2016-01-09 MED ORDER — METOCLOPRAMIDE HCL 5 MG/ML IJ SOLN
INTRAMUSCULAR | Status: DC | PRN
  Administered 2016-01-09: 10 mg via INTRAVENOUS

## 2016-01-09 MED ORDER — FENTANYL CITRATE (PF) 250 MCG/5ML IJ SOLN
INTRAMUSCULAR | Status: AC
  Filled 2016-01-09: qty 5

## 2016-01-09 SURGICAL SUPPLY — 86 items
ADH SKN CLS APL DERMABOND .7 (GAUZE/BANDAGES/DRESSINGS) ×1
APPLIER CLIP 9.375 MED OPEN (MISCELLANEOUS) ×3
APR CLP MED 9.3 20 MLT OPN (MISCELLANEOUS) ×1
BAG DECANTER FOR FLEXI CONT (MISCELLANEOUS) ×3 IMPLANT
BINDER BREAST LRG (GAUZE/BANDAGES/DRESSINGS) IMPLANT
BINDER BREAST XLRG (GAUZE/BANDAGES/DRESSINGS) ×2 IMPLANT
BIOPATCH RED 1 DISK 7.0 (GAUZE/BANDAGES/DRESSINGS) ×4 IMPLANT
BIOPATCH RED 1IN DISK 7.0MM (GAUZE/BANDAGES/DRESSINGS) ×1
CANISTER SUCTION 2500CC (MISCELLANEOUS) ×7 IMPLANT
CHLORAPREP W/TINT 26ML (MISCELLANEOUS) ×6 IMPLANT
CLIP APPLIE 9.375 MED OPEN (MISCELLANEOUS) ×1 IMPLANT
CONT SPEC 4OZ CLIKSEAL STRL BL (MISCELLANEOUS) ×9 IMPLANT
COVER MAYO STAND STRL (DRAPES) ×2 IMPLANT
COVER PROBE W GEL 5X96 (DRAPES) ×3 IMPLANT
COVER SURGICAL LIGHT HANDLE (MISCELLANEOUS) ×4 IMPLANT
DERMABOND ADVANCED (GAUZE/BANDAGES/DRESSINGS) ×2
DERMABOND ADVANCED .7 DNX12 (GAUZE/BANDAGES/DRESSINGS) ×2 IMPLANT
DEVICE DISSECT PLASMABLAD 3.0S (MISCELLANEOUS) IMPLANT
DRAIN CHANNEL 19F RND (DRAIN) ×4 IMPLANT
DRAPE CHEST BREAST 15X10 FENES (DRAPES) ×3 IMPLANT
DRAPE ORTHO SPLIT 77X108 STRL (DRAPES)
DRAPE PROXIMA HALF (DRAPES) ×3 IMPLANT
DRAPE SURG 17X23 STRL (DRAPES) ×14 IMPLANT
DRAPE SURG ORHT 6 SPLT 77X108 (DRAPES) ×2 IMPLANT
DRAPE WARM FLUID 44X44 (DRAPE) ×3 IMPLANT
DRSG PAD ABDOMINAL 8X10 ST (GAUZE/BANDAGES/DRESSINGS) ×4 IMPLANT
DRSG TEGADERM 4X4.75 (GAUZE/BANDAGES/DRESSINGS) ×1 IMPLANT
ELECT BLADE 4.0 EZ CLEAN MEGAD (MISCELLANEOUS) ×6
ELECT CAUTERY BLADE 6.4 (BLADE) ×3 IMPLANT
ELECT REM PT RETURN 9FT ADLT (ELECTROSURGICAL) ×3
ELECTRODE BLDE 4.0 EZ CLN MEGD (MISCELLANEOUS) ×1 IMPLANT
ELECTRODE REM PT RTRN 9FT ADLT (ELECTROSURGICAL) ×2 IMPLANT
EVACUATOR SILICONE 100CC (DRAIN) ×4 IMPLANT
EXPANDER BREAST 300CC (Expander) ×2 IMPLANT
GAUZE SPONGE 4X4 12PLY STRL (GAUZE/BANDAGES/DRESSINGS) ×3 IMPLANT
GLOVE BIO SURGEON STRL SZ 6.5 (GLOVE) ×3 IMPLANT
GLOVE BIO SURGEONS STRL SZ 6.5 (GLOVE) ×1
GLOVE BIOGEL PI IND STRL 6.5 (GLOVE) IMPLANT
GLOVE BIOGEL PI IND STRL 7.0 (GLOVE) IMPLANT
GLOVE BIOGEL PI IND STRL 8 (GLOVE) IMPLANT
GLOVE BIOGEL PI INDICATOR 6.5 (GLOVE) ×2
GLOVE BIOGEL PI INDICATOR 7.0 (GLOVE) ×6
GLOVE BIOGEL PI INDICATOR 8 (GLOVE) ×6
GLOVE EUDERMIC 7 POWDERFREE (GLOVE) ×5 IMPLANT
GLOVE SURG SS PI 6.5 STRL IVOR (GLOVE) ×4 IMPLANT
GLOVE SURG SS PI 7.0 STRL IVOR (GLOVE) ×4 IMPLANT
GOWN STRL REUS W/ TWL LRG LVL3 (GOWN DISPOSABLE) ×3 IMPLANT
GOWN STRL REUS W/ TWL XL LVL3 (GOWN DISPOSABLE) ×1 IMPLANT
GOWN STRL REUS W/TWL LRG LVL3 (GOWN DISPOSABLE) ×18
GOWN STRL REUS W/TWL XL LVL3 (GOWN DISPOSABLE) ×3
GRAFT FLEX HD 4X16 THICK (Tissue Mesh) ×2 IMPLANT
ILLUMINATOR WAVEGUIDE N/F (MISCELLANEOUS) IMPLANT
KIT BASIN OR (CUSTOM PROCEDURE TRAY) ×4 IMPLANT
KIT ROOM TURNOVER OR (KITS) ×4 IMPLANT
LIGHT WAVEGUIDE WIDE FLAT (MISCELLANEOUS) IMPLANT
NDL 18GX1X1/2 (RX/OR ONLY) (NEEDLE) IMPLANT
NDL FILTER BLUNT 18X1 1/2 (NEEDLE) IMPLANT
NDL HYPO 25GX1X1/2 BEV (NEEDLE) IMPLANT
NEEDLE 18GX1X1/2 (RX/OR ONLY) (NEEDLE) ×3 IMPLANT
NEEDLE FILTER BLUNT 18X 1/2SAF (NEEDLE) ×2
NEEDLE FILTER BLUNT 18X1 1/2 (NEEDLE) ×1 IMPLANT
NEEDLE HYPO 25GX1X1/2 BEV (NEEDLE) ×3 IMPLANT
NS IRRIG 1000ML POUR BTL (IV SOLUTION) ×7 IMPLANT
PACK GENERAL/GYN (CUSTOM PROCEDURE TRAY) ×4 IMPLANT
PAD ABD 8X10 STRL (GAUZE/BANDAGES/DRESSINGS) ×4 IMPLANT
PAD ARMBOARD 7.5X6 YLW CONV (MISCELLANEOUS) ×4 IMPLANT
PIN SAFETY STERILE (MISCELLANEOUS) ×3 IMPLANT
PLASMABLADE 3.0S (MISCELLANEOUS)
SET ASEPTIC TRANSFER (MISCELLANEOUS) ×2 IMPLANT
SPECIMEN JAR X LARGE (MISCELLANEOUS) ×3 IMPLANT
SPONGE LAP 18X18 X RAY DECT (DISPOSABLE) ×2 IMPLANT
SUT ETHILON 3 0 FSL (SUTURE) ×3 IMPLANT
SUT MNCRL AB 4-0 PS2 18 (SUTURE) ×5 IMPLANT
SUT MON AB 3-0 SH 27 (SUTURE) ×3
SUT MON AB 3-0 SH27 (SUTURE) ×2 IMPLANT
SUT MON AB 5-0 PS2 18 (SUTURE) ×4 IMPLANT
SUT PDS AB 2-0 CT1 27 (SUTURE) ×10 IMPLANT
SUT SILK 2 0 FS (SUTURE) ×3 IMPLANT
SUT SILK 4 0 PS 2 (SUTURE) ×3 IMPLANT
SUT VIC AB 3-0 SH 18 (SUTURE) ×3 IMPLANT
SYR CONTROL 10ML LL (SYRINGE) ×2 IMPLANT
TOWEL OR 17X24 6PK STRL BLUE (TOWEL DISPOSABLE) ×4 IMPLANT
TOWEL OR 17X26 10 PK STRL BLUE (TOWEL DISPOSABLE) ×4 IMPLANT
TRAY FOLEY CATH 14FRSI W/METER (CATHETERS) IMPLANT
TUBE CONNECTING 12'X1/4 (SUCTIONS)
TUBE CONNECTING 12X1/4 (SUCTIONS) IMPLANT

## 2016-01-09 NOTE — Brief Op Note (Addendum)
01/09/2016  10:53 AM  PATIENT:  [name]  50 y.o. female  PRE-OPERATIVE DIAGNOSIS:  cancer right breast  POST-OPERATIVE DIAGNOSIS:  cancer right breast  PROCEDURE:  Procedure(s): INJECTION BLUE DYE RIGHT BREAST TOTAL MASTECTOMY WITH  RIGHT AXILLARY SENTINEL NODE BIOPSY (Right) BREAST RECONSTRUCTION WITH PLACEMENT OF TISSUE EXPANDER AND FLEX HD (ACELLULAR HYDRATED DERMIS) (Right)  SURGEON:  Surgeon(s) and Role: Panel 1:    * Fanny Skates, MD - Primary    * Jackolyn Confer, MD - Assisting  Panel 2:    * Loel Lofty Dillingham, DO - Primary  PHYSICIAN ASSISTANT: Shawn Rayburn, PA  ASSISTANTS: none   ANESTHESIA:   general  EBL:     BLOOD ADMINISTERED:none  DRAINS: (1) Jackson-Pratt drain(s) with closed bulb suction in the right breast pocket   LOCAL MEDICATIONS USED:  NONE  SPECIMEN:  No Specimen  DISPOSITION OF SPECIMEN:  N/A  COUNTS:  YES  TOURNIQUET:  * No tourniquets in log *  DICTATION: .Dragon Dictation  PLAN OF CARE: Admit for overnight observation  PATIENT DISPOSITION:  PACU - hemodynamically stable.   Delay start of Pharmacological VTE agent (>24hrs) due to surgical blood loss or risk of bleeding: no

## 2016-01-09 NOTE — Interval H&P Note (Signed)
History and Physical Interval Note:  01/09/2016 8:46 AM  [name]  has presented today for surgery, with the diagnosis of cancer right breast  The various methods of treatment have been discussed with the patient and family. After consideration of risks, benefits and other options for treatment, the patient has consented to  Procedure(s): INJECTION BLUE DYE RIGHT BREAST TOTAL MASTECTOMY WITH  RIGHT AXILLARY SENTINEL NODE BIOPSY (Right) BREAST RECONSTRUCTION WITH PLACEMENT OF TISSUE EXPANDER AND FLEX HD (ACELLULAR HYDRATED DERMIS) (Right) as a surgical intervention .  The patient's history has been reviewed, patient examined, no change in status, stable for surgery.  I have reviewed the patient's chart and labs.  Questions were answered to the patient's satisfaction.     [name]

## 2016-01-09 NOTE — Transfer of Care (Signed)
Immediate Anesthesia Transfer of Care Note  Patient: [name]  Procedure(s) Performed: Procedure(s): INJECTION BLUE DYE RIGHT BREAST,  TOTAL MASTECTOMY WITH  RIGHT AXILLARY SENTINEL NODE BIOPSY (Right) BREAST RECONSTRUCTION WITH PLACEMENT OF TISSUE EXPANDER AND FLEX HD (ACELLULAR HYDRATED DERMIS) (Right)  Patient Location: PACU  Anesthesia Type:General  Level of Consciousness: awake, alert , oriented and patient cooperative  Airway & Oxygen Therapy: Patient Spontanous Breathing and Patient connected to nasal cannula oxygen  Post-op Assessment: Report given to RN, Post -op Vital signs reviewed and stable and Patient moving all extremities  Post vital signs: Reviewed and stable  Last Vitals:  Filed Vitals:   01/09/16 1257 01/09/16 1258  BP: 106/53   Pulse: 84   Temp:  36.6 C  Resp: 16     Last Pain: There were no vitals filed for this visit.    Patients Stated Pain Goal: 3 ([id number])  Complications: No apparent anesthesia complications

## 2016-01-09 NOTE — H&P (Signed)
[name] is an 50 y.o. female.   Chief Complaint: right breast cancer HPI: The patient is a 50 y.o. yrs old wf here for a history and physical prior to right breast reconstruction with tissue expander and acellular dermal matrix. She was diagnosed with invasive ductal carcinoma of the RIGHT breast, receptor positive, HER-2 negative, Ki-67 7%. She felt a right breast mass which started the process of diagnosis. The CT and MRI showed portal hypertension, splenomegaly and the known breast mass. She is planning on a skin sparing but not nipple sparing mastectomy of the right breast. 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 The right breast is bruised from the biopsy and the mass palpated. She has a vertical abdominal scar from the hysterectomy. She is married and has an adopted daughter. She is 5 feet 6 inches tall and weighs 220 pounds. Preop is likely a 40 B/C bra size. PMHx significant for Psoriatic arthritis, Cirrhosis with portal hypertension, hypertension and hypercholesterolemia.  She was on ENBREL until the past month for her psoriatic arthritis and is considering a new immuno-suppressive drug with her rheumatologist once she has healed sufficiently.   Past Medical History  Diagnosis Date  . HTN (hypertension)   . Psoriatic arthritis ([place])   . Breast cancer ([place])   . Anxiety     Past Surgical History  Procedure Laterality Date  . Total abdominal hysterectomy    . Tonsillectomy      Family History  Problem Relation Age of Onset  . Cancer - Colon Cousin    Social History:  reports that she has never smoked. She does not have any smokeless tobacco history on file. She reports that she drinks alcohol. She reports that she does not use illicit drugs.  Allergies:  Allergies  Allergen Reactions  . Xanax [Alprazolam] Hives and Shortness Of Breath    swelling  . Tape     No prescriptions prior to admission    No results found for this or any previous visit (from the  past 48 hour(s)). No results found.  Review of Systems  Constitutional: Negative.   HENT: Negative.   Eyes: Negative.   Respiratory: Negative.   Cardiovascular: Negative.   Gastrointestinal: Negative.   Genitourinary: Negative.   Musculoskeletal: Negative.   Skin: Negative.   Neurological: Negative.   Psychiatric/Behavioral: Negative.     There were no vitals taken for this visit. Physical Exam  Constitutional: She is oriented to person, place, and time. She appears well-developed and well-nourished.  HENT:  Head: Normocephalic and atraumatic.  Eyes: Conjunctivae and EOM are normal. Pupils are equal, round, and reactive to light.  Cardiovascular: Normal rate.   Respiratory: Effort normal. No respiratory distress. She has no wheezes.  GI: Soft. She exhibits no distension. There is no tenderness.  Neurological: She is alert and oriented to person, place, and time.  Skin: Skin is warm.  Psychiatric: She has a normal mood and affect. Her behavior is normal. Judgment and thought content normal.     Assessment/Plan Plan for right breast immediate reconstruction with expander and FlexHD.    Wallace Going, DO 01/09/2016, 7:13 AM

## 2016-01-09 NOTE — Progress Notes (Signed)
Spoke with surgeon on call about [name] JP output=190cc, orders where given for CBC in AM. RN will continue to monitor JP output.

## 2016-01-09 NOTE — Anesthesia Preprocedure Evaluation (Signed)
Anesthesia Evaluation  Patient identified by MRN, date of birth, ID band Patient awake    Reviewed: Allergy & Precautions, NPO status , Patient's Chart, lab work & pertinent test results  Airway Mallampati: II  TM Distance: >3 FB Neck ROM: Full    Dental no notable dental hx.    Pulmonary neg pulmonary ROS,    Pulmonary exam normal breath sounds clear to auscultation       Cardiovascular hypertension, Pt. on medications Normal cardiovascular exam Rhythm:Regular Rate:Normal     Neuro/Psych negative neurological ROS  negative psych ROS   GI/Hepatic negative GI ROS, Neg liver ROS,   Endo/Other  negative endocrine ROS  Renal/GU negative Renal ROS  negative genitourinary   Musculoskeletal negative musculoskeletal ROS (+)   Abdominal   Peds negative pediatric ROS (+)  Hematology negative hematology ROS (+)   Anesthesia Other Findings   Reproductive/Obstetrics negative OB ROS                             Anesthesia Physical Anesthesia Plan  ASA: II  Anesthesia Plan: General   Post-op Pain Management: GA combined w/ Regional for post-op pain   Induction: Intravenous  Airway Management Planned: Oral ETT  Additional Equipment:   Intra-op Plan:   Post-operative Plan: Extubation in OR  Informed Consent: I have reviewed the patients History and Physical, chart, labs and discussed the procedure including the risks, benefits and alternatives for the proposed anesthesia with the patient or authorized representative who has indicated his/her understanding and acceptance.   Dental advisory given  Plan Discussed with: CRNA and Surgeon  Anesthesia Plan Comments:         Anesthesia Quick Evaluation

## 2016-01-09 NOTE — Anesthesia Procedure Notes (Addendum)
Anesthesia Regional Block:  Pectoralis block  Pre-Anesthetic Checklist: ,, timeout performed, Correct Patient, Correct Site, Correct Laterality, Correct Procedure, Correct Position, site marked, Risks and benefits discussed,  Surgical consent,  Pre-op evaluation,  At surgeon's request and post-op pain management  Laterality: Right  Prep: chloraprep       Needles:  Injection technique: Single-shot  Needle Type: Echogenic Needle     Needle Length: 9cm 9 cm Needle Gauge: 21 and 21 G    Additional Needles:  Procedures: ultrasound guided (picture in chart) Pectoralis block Narrative:  Injection made incrementally with aspirations every 5 mL.  Performed by: Personally   Additional Notes: Patient tolerated the procedure well without complications   Procedure Name: Intubation Date/Time: 01/09/2016 10:12 AM Performed by: Willeen Cass P Pre-anesthesia Checklist: Patient identified, Timeout performed, Emergency Drugs available, Suction available and Patient being monitored Patient Re-evaluated:Patient Re-evaluated prior to inductionOxygen Delivery Method: Circle system utilized Preoxygenation: Pre-oxygenation with 100% oxygen Intubation Type: IV induction Ventilation: Mask ventilation without difficulty Laryngoscope Size: Mac and 3 Grade View: Grade I Tube type: Oral Tube size: 7.0 mm Number of attempts: 1 Airway Equipment and Method: Stylet Placement Confirmation: breath sounds checked- equal and bilateral,  positive ETCO2 and ETT inserted through vocal cords under direct vision Secured at: 22 cm Tube secured with: Tape Dental Injury: Teeth and Oropharynx as per pre-operative assessment

## 2016-01-09 NOTE — Op Note (Signed)
Patient Name:           [name]   Date of Surgery:        01/09/2016  Pre op Diagnosis:      Multicentric cancer right breast, clinical stage T2, N0, receptor positive, HER-2 negative  Post op Diagnosis:    Same  Procedure:                 Inject blue dye right breast                                      Right total mastectomy                                      Right axillary sentinel lymph node biopsy  Surgeon:                     Edsel Petrin. Dalbert Batman, M.D., FACS  Assistant:                      Jackolyn Confer, M.D., [facility]  Operative Indications:   This is a 50 year old female who returns  to discuss management of her multicentric right breast cancer. She was initially seen in the [facility] by Dr. Sondra Come, Dr. Lindi Adie, and me. Dr. Shirline Frees is her PCP.  she noted a palpable mass in the right breast above the areolar margin. Imaging studies showed very dense breasts. Ultrasound showed a 2.7 cm mass in the right breast upper outer quadrant and a normal axilla.  This is fairly centrally located just above the areolar margin.Image guided biopsy showed grade 2 invasive carcinoma right breast, receptor positive, HER-2/neu negative, Ki-67 7%. Subsequent MRI showed a mass in the right breast upper inner quadrant which was also biopsied and also showed invasive duct carcinoma. She had a left breast mass which was biopsied and showed benign fibrocystic changes.  She therefore has multicentric invasive breast cancer, upper outer and upper inner.  Dr. Lindi Adie has stated that he will decide about chemotherapy after the final pathology is him and he gets this result.  Comorbidities include BMI 35, hypertension, liver disease, daily alcohol use. She has now stopped drinking. Psoriatic arthritis. Abdominal hysterectomy for fibroids but ovaries remain in place.  Her staging scans showed no metastatic disease, but because her staging scans showed cirrhosis, portal hypertension,  gastric varices, and splenomegaly, she was referred to Dr. Benson Norway of gastroenterology. He performed an upper endoscopy and saw a large gastric varix. He thinks she is a Child's A cirrhotic. Platelet count 114,000. He cleared her to go ahead with surgery. She has seen Dr. Marla Roe of plastic surgery. After lengthy discussion and decision making she has decided she wants immediate implant-based reconstruction. I think that is reasonable. She knows that she may need a contralateral reduction at some point down the line. She knows that there is no indication for prophylactic contralateral mastectomy in her case.  She will be scheduled for right total mastectomy, right axillary sentinel node biopsy, and immediate implant-based reconstruction.   Operative Findings:       The patient underwent a pectoral block in the holding area.  The patient underwent injection of technetium 99 radionuclide in the holding area.  The breast tissue was fairly dense throughout.  The primary cancer in the  central upper outer quadrant was palpable but was not invading the skin.  The cancer in the upper inner quadrant was not palpable.  As I dissected the skin flaps in the upper inner quadrant I encountered a small metallic object consistent with a biopsy marker clip.  There was no palpable mass beneath this but I placed a Vicryl suture in the breast specimen below this and marked the skin above it in case the cancer was present at the anterior margin.  Dr. Marla Roe may resect this area.  I found 3 sentinel lymph nodes.  Procedure in Detail:          The patient was brought to the operating room and general endotracheal anesthesia was induced.  Surgical timeout was performed.  Intravenous antibiotics were given.  Following alcohol prep I injected 5 mL of blue dye into the right breast subareolar area.  This was methylene blue mixed with saline.  The breast was massaged for a few minutes.  We then prepped and draped the neck  and anterior and lateral chest walls in typical fashion.     Skin markings of the midline and breast boundaries were made.  I then marked a transverse elliptical incision, fairly conservative skin sparing incision.  This incision was made.  Skin flaps were raised superiorly to the infraclavicular area, medially to the parasternal area, inferiorly to the anterior rectus sheath and laterally to latissimus dorsi muscle.  I placed long black silk suture in the lateral skin margin.  The breast was dissected off the pectoralis major and minor muscles.  When I entered the axilla I used the neoprobe and found 3 obvious sentinel nodes, all 3 very hot and very blue.  These were sent as separate specimens.  I  then dissected the rest of the axillary tail of the breast and sent the entire  breast specimen as a separate specimen.  Hemostasis was excellent and achieved with  electrocautery.  The wound was irrigated with saline.  The wound was then packed with saline gauze.    At this point in the case  blood loss was 100 mL or less.  Counts were correct.  Complications none.  At this point in the case Dr. Marla Roe assumed control of  the operative procedure.  I scrubbed out.  She will dictate her operative procedure separately.     Edsel Petrin. Dalbert Batman, M.D., FACS General and Minimally Invasive Surgery Breast and Colorectal Surgery  01/09/2016 11:50 AM

## 2016-01-09 NOTE — Op Note (Addendum)
Op report    DATE OF OPERATION:  01/09/2016  LOCATION: [facility]  SURGICAL DIVISION: Plastic Surgery  PREOPERATIVE DIAGNOSES:  1.Right Breast cancer.    POSTOPERATIVE DIAGNOSES:  1. Right Breast cancer.   PROCEDURE:  1. Right immediate breast reconstruction with placement of Acellular Dermal Matrix and tissue expanders.  SURGEON: Claire Sanger Dillingham, DO  ASSISTANT: Shawn Rayburn, PA  ANESTHESIA:  General.   COMPLICATIONS: None.   EXPANDER: Right - [place]  300cc. High profile (250 cc of saline placed) Acellular Dermal Matrix - FlexHD 4 x 16 cm  INDICATIONS FOR PROCEDURE:  The patient, [name], is a 50 y.o. female born on 05/08/1966, is here for  immediate first stage breast reconstruction with placement of right tissue expander and Acellular dermal matrix. MRN: [id number]  CONSENT:  Informed consent was obtained directly from the patient. Risks, benefits and alternatives were fully discussed. Specific risks including but not limited to bleeding, infection, hematoma, seroma, scarring, pain, implant infection, implant extrusion, capsular contracture, asymmetry, wound healing problems, and need for further surgery were all discussed. The patient did have an ample opportunity to have her questions answered to her satisfaction.   DESCRIPTION OF PROCEDURE:  The patient was taken to the operating room by the general surgery team. SCDs were placed and IV antibiotics were given. The patient's chest was prepped and draped in a sterile fashion. A time out was performed and the implants to be used were identified.  Right mastectomy was performed.  Once the general surgery team had completed their portion of the case the patient was rendered to the plastic and reconstructive surgery team.  The pectoralis major muscle was lifted from the chest wall with release of the lateral edge and lateral inframammary fold.  The pocket was irrigated with antibiotic solution and  hemostasis was achieved with electrocautery.  The ADM was then prepared according to the manufacture guidelines and slits placed to help with postoperative fluid management.  The ADM was then sutured to the inferior and lateral edge of the inframammary fold with 2-0 PDS starting with an interrupted stitch and then a running stitch.  The lateral portion was sutured to with interrupted sutures after the expander was placed.  The expander was prepared according to the manufacture guidelines, the air evacuated and then it was placed under the ADM and pectoralis major muscle.  The inferior and lateral tabs were used to secure the expander to the chest wall with 2-0 PDS.  The drain was placed at the inframammary fold over the ADM and secured to the skin with 3-0 Silk.   There was a 1 x 1 cm area of concern expressed by the General surgery team on the medial superior flap.  A margin was sent with the deep portion inked.  The deep layers were closed with 3-0 Vicryl followed by 4-0 Monocryl.  The skin was closed with 5-0 Monocryl and then dermabond was applied.  The ABDs and breast binder were placed.  The patient tolerated the procedure well and there were no complications.  The patient was allowed to wake from anesthesia and taken to the recovery room in satisfactory condition.

## 2016-01-10 DIAGNOSIS — C50111 Malignant neoplasm of central portion of right female breast: Secondary | ICD-10-CM | POA: Diagnosis not present

## 2016-01-10 LAB — CBC
HEMATOCRIT: 33.1 % — AB (ref 36.0–46.0)
HEMATOCRIT: 34.2 % — AB (ref 36.0–46.0)
HEMOGLOBIN: 11.1 g/dL — AB (ref 12.0–15.0)
Hemoglobin: 11.6 g/dL — ABNORMAL LOW (ref 12.0–15.0)
MCH: 31.4 pg (ref 26.0–34.0)
MCH: 31.7 pg (ref 26.0–34.0)
MCHC: 33.5 g/dL (ref 30.0–36.0)
MCHC: 33.9 g/dL (ref 30.0–36.0)
MCV: 93.5 fL (ref 78.0–100.0)
MCV: 93.4 fL (ref 78.0–100.0)
PLATELETS: 171 10*3/uL (ref 150–400)
Platelets: 144 10*3/uL — ABNORMAL LOW (ref 150–400)
RBC: 3.54 MIL/uL — AB (ref 3.87–5.11)
RBC: 3.66 MIL/uL — AB (ref 3.87–5.11)
RDW: 12.7 % (ref 11.5–15.5)
RDW: 12.8 % (ref 11.5–15.5)
WBC: 14.3 10*3/uL — ABNORMAL HIGH (ref 4.0–10.5)
WBC: 17.5 10*3/uL — ABNORMAL HIGH (ref 4.0–10.5)

## 2016-01-10 LAB — BASIC METABOLIC PANEL
ANION GAP: 8 (ref 5–15)
BUN: 12 mg/dL (ref 6–20)
CO2: 26 mmol/L (ref 22–32)
CREATININE: 0.67 mg/dL (ref 0.44–1.00)
Calcium: 9.2 mg/dL (ref 8.9–10.3)
Chloride: 100 mmol/L — ABNORMAL LOW (ref 101–111)
GFR calc non Af Amer: >60 mL/min (ref >60)
Glucose, Bld: 148 mg/dL — ABNORMAL HIGH (ref 65–99)
POTASSIUM: 4.4 mmol/L (ref 3.5–5.1)
SODIUM: 134 mmol/L — AB (ref 135–145)

## 2016-01-10 LAB — URINALYSIS, ROUTINE W REFLEX MICROSCOPIC
Bilirubin Urine: NEGATIVE
Glucose, UA: NEGATIVE mg/dL
Hgb urine dipstick: NEGATIVE
Ketones, ur: NEGATIVE mg/dL
LEUKOCYTES UA: NEGATIVE
NITRITE: NEGATIVE
PH: 6 (ref 5.0–8.0)
Protein, ur: NEGATIVE mg/dL
SPECIFIC GRAVITY, URINE: 1.025 (ref 1.005–1.030)

## 2016-01-10 LAB — PROTIME-INR
INR: 1.36 (ref 0.00–1.49)
Prothrombin Time: 16.9 seconds — ABNORMAL HIGH (ref 11.6–15.2)

## 2016-01-10 MED ORDER — LORAZEPAM 1 MG PO TABS
1.0000 mg | ORAL_TABLET | Freq: Four times a day (QID) | ORAL | Status: DC | PRN
  Administered 2016-01-10 – 2016-01-11 (×2): 1 mg via ORAL
  Filled 2016-01-10 (×2): qty 1

## 2016-01-10 MED ORDER — CITALOPRAM HYDROBROMIDE 20 MG PO TABS
20.0000 mg | ORAL_TABLET | Freq: Every day | ORAL | Status: DC
  Administered 2016-01-10 – 2016-01-11 (×2): 20 mg via ORAL
  Filled 2016-01-10 (×2): qty 1

## 2016-01-10 NOTE — Progress Notes (Signed)
1 Day Post-Op  Subjective: Pt with nose bleed   Objective: Vital signs in last 24 hours: Temp:  [97.8 F (36.6 C)-98.5 F (36.9 C)] 98.5 F (36.9 C) (05/26 0231) Pulse Rate:  [67-85] 74 (05/26 0231) Resp:  [11-18] 18 (05/25 1452) BP: (101-135)/(48-83) 110/49 mmHg (05/26 0231) SpO2:  [94 %-100 %] 94 % (05/26 0231) Weight:  [100.064 kg (220 lb 9.6 oz)] 100.064 kg (220 lb 9.6 oz) (05/25 1452) Last BM Date: 01/09/16  Intake/Output from previous day: 05/25 0701 - 05/26 0700 In: 2844.9 [P.O.:120; I.V.:2724.9] Out: 1485 [Urine:600; Drains:860; Blood:25] Intake/Output this shift: Total I/O In: -  Out: 380 [Urine:350; Drains:30]  Incision/Wound:flaps viable no hematoma noted   Drains serous right   Lab Results:   Recent Labs  01/09/16 1519 01/10/16 0709  WBC 5.8 14.3*  HGB 12.8 11.1*  HCT 36.8 33.1*  PLT 122* 144*   BMET  Recent Labs  01/09/16 1519  NA 137  K 3.2*  CL 104  CO2 23  GLUCOSE 141*  BUN 12  CREATININE 0.56  CALCIUM 8.8*   PT/INR No results for input(s): LABPROT, INR in the last 72 hours. ABG No results for input(s): PHART, HCO3 in the last 72 hours.  Invalid input(s): PCO2, PO2  Studies/Results: Nm Sentinel Node Inj-no Rpt (breast)  01/09/2016  CLINICAL DATA: cancer right breast Sulfur colloid was injected intradermally by the nuclear medicine technologist for breast cancer sentinel node localization.    Anti-infectives: Anti-infectives    Start     Dose/Rate Route Frequency Ordered Stop   01/09/16 1600  ceFAZolin (ANCEF) IVPB 2g/100 mL premix     2 g 200 mL/hr over 30 Minutes Intravenous Every 8 hours 01/09/16 1454     01/09/16 1158  polymyxin B 500,000 Units, bacitracin 50,000 Units in sodium chloride irrigation 0.9 % 500 mL irrigation  Status:  Discontinued       As needed 01/09/16 1158 01/09/16 1257   01/09/16 0809  ceFAZolin (ANCEF) IVPB 2g/100 mL premix  Status:  Discontinued     2 g 200 mL/hr over 30 Minutes Intravenous On call to  O.R. 01/09/16 0809 01/09/16 0810   01/09/16 0809  ceFAZolin (ANCEF) IVPB 2g/100 mL premix     2 g 200 mL/hr over 30 Minutes Intravenous On call to O.R. 01/09/16 0809 01/09/16 1030      Assessment/Plan: s/p Procedure(s): INJECTION BLUE DYE RIGHT BREAST,  TOTAL MASTECTOMY WITH  RIGHT AXILLARY SENTINEL NODE BIOPSY (Right) BREAST RECONSTRUCTION WITH PLACEMENT OF TISSUE EXPANDER AND FLEX HD (ACELLULAR HYDRATED DERMIS) (Right) Nose does not appear to be bleeding any more Hold any anticoagulation since she is a Childs A cirrhotic Check coags if bleeding becomes recurrent No hematoma on exam  Discharge per plastics      [name] A. 01/10/2016

## 2016-01-10 NOTE — Progress Notes (Addendum)
Patient ID: [name], female   DOB: 1965-11-26, 50 y.o.   MRN: [id number]  Follow up note for Plastic Surgery:   JP drainage has declined following the additional expander fill this am. Drainage since 7 am ~ 70 ml. Hgb stable 11.6, Plts 171K and INR 1.36.  Right breast flap viable and without hematoma.  Will allow patient to resume eating, decrease IVF and continue to monitor closely.   RAYBURN,SHAWN,PA-C Plastic Surgery [phone]

## 2016-01-10 NOTE — Progress Notes (Signed)
Offered pt. A bath but she refused for now . Pt. Stated she wanted to see in about an hour how she felt about the bath.I inform the pt. Just to let me know

## 2016-01-10 NOTE — Progress Notes (Signed)
Patient ID: Palmer Crincoli, female   DOB: Feb 01, 1966, 50 y.o.   MRN: [id number]  Plastic Surgery follow up:  Patient remains stable since earlier today. BP 110/40 HR 67. Blood pressure medications held today due to BP running low, although patient reports her medications have been decreased by the PCP over the past month due to lower BP baseline.  The breast flap remains viable and OP is decreased significantly. (140 ml over the day today)   Will plan to watch overnight and repeat labs in the morning and if OP and labs okay, will plan to DC.   RAYBURN,SHAWN,PA-C Plastic Surgery [phone] 16:43 pm  Patient seen and examined, alert, NAD. Reports good pain control On exam Ace Wrap and Breast binder in place, chest flat and soft, developing ecchymoses as expected Drain watery  Plan continued observation overnight and possible discharge in am. Encouraged to ambulate in halls. Discussed with patient and husband no absolute abnormal drainage amount for when they are at home but certainly the amount overnight warrants an exam and call to [place]. Counseled if problems at home how to contact me.  Irene Limbo, MD [facility] Plastic & Reconstructive Surgery [phone], pin [phone]

## 2016-01-10 NOTE — Anesthesia Postprocedure Evaluation (Signed)
Anesthesia Post Note  Patient: [name]  Procedure(s) Performed: Procedure(s) (LRB): INJECTION BLUE DYE RIGHT BREAST,  TOTAL MASTECTOMY WITH  RIGHT AXILLARY SENTINEL NODE BIOPSY (Right) BREAST RECONSTRUCTION WITH PLACEMENT OF TISSUE EXPANDER AND FLEX HD (ACELLULAR HYDRATED DERMIS) (Right)  Patient location during evaluation: PACU Anesthesia Type: General and Regional Level of consciousness: awake and alert Pain management: pain level controlled Vital Signs Assessment: post-procedure vital signs reviewed and stable Respiratory status: spontaneous breathing, nonlabored ventilation, respiratory function stable and patient connected to nasal cannula oxygen Cardiovascular status: blood pressure returned to baseline and stable Postop Assessment: no signs of nausea or vomiting Anesthetic complications: no    Last Vitals:  Filed Vitals:   01/10/16 0231 01/10/16 0944  BP: 110/49 114/51  Pulse: 74 76  Temp: 36.9 C   Resp:  20    Last Pain:  Filed Vitals:   01/10/16 1141  PainSc: 3                  [name] S

## 2016-01-10 NOTE — Progress Notes (Signed)
1 Day Post-Op  Subjective: JP drain OP high as noted. Drainage is dark blood, likely venous, but will need to continue to monitor OP closely. Will plan to keep patient npo this am and recheck labs at noon. BP 110/49 HR in the 70's. The right breast flap is viable, some bruising especially laterally, no hematoma.  Hgb 11.1 down from 12.8 post operatively. Plts slightly up from post op. INR 1.4  The right expander was filled with 50 ml of sterile injectable saline under sterile techniques for a total now of 300/365ml. The patient tolerated the fill well. This may help to stem some of the venous oozing.    Objective: Vital signs in last 24 hours: Temp:  [97.8 F (36.6 C)-98.5 F (36.9 C)] 98.5 F (36.9 C) (05/26 0231) Pulse Rate:  [67-85] 74 (05/26 0231) Resp:  [11-18] 18 (05/25 1452) BP: (101-135)/(48-83) 110/49 mmHg (05/26 0231) SpO2:  [94 %-100 %] 94 % (05/26 0231) Weight:  [100.064 kg (220 lb 9.6 oz)] 100.064 kg (220 lb 9.6 oz) (05/25 1452) Last BM Date: 01/09/16  Intake/Output from previous day: 05/25 0701 - 05/26 0700 In: 2844.9 [P.O.:120; I.V.:2724.9] Out: 1485 [Urine:600; Drains:860; Blood:25] Intake/Output this shift: Total I/O In: -  Out: 380 [Urine:350; Drains:30]  General appearance: alert, cooperative, mild distress and axious Resp: clear to auscultation bilaterally Cardio: regular rate and rhythm the right breast flap is viable and without hematoma. some brusing especially laterally. JP drainage is large and dark sanguinous drainage.   Lab Results:   Recent Labs  01/09/16 1519 01/10/16 0709  WBC 5.8 14.3*  HGB 12.8 11.1*  HCT 36.8 33.1*  PLT 122* 144*   BMET  Recent Labs  01/09/16 1519  NA 137  K 3.2*  CL 104  CO2 23  GLUCOSE 141*  BUN 12  CREATININE 0.56  CALCIUM 8.8*   PT/INR No results for input(s): LABPROT, INR in the last 72 hours. ABG No results for input(s): PHART, HCO3 in the last 72 hours.  Invalid input(s): PCO2,  PO2  Studies/Results: Nm Sentinel Node Inj-no Rpt (breast)  01/09/2016  CLINICAL DATA: cancer right breast Sulfur colloid was injected intradermally by the nuclear medicine technologist for breast cancer sentinel node localization.    Anti-infectives: Anti-infectives    Start     Dose/Rate Route Frequency Ordered Stop   01/09/16 1600  ceFAZolin (ANCEF) IVPB 2g/100 mL premix     2 g 200 mL/hr over 30 Minutes Intravenous Every 8 hours 01/09/16 1454     01/09/16 1158  polymyxin B 500,000 Units, bacitracin 50,000 Units in sodium chloride irrigation 0.9 % 500 mL irrigation  Status:  Discontinued       As needed 01/09/16 1158 01/09/16 1257   01/09/16 0809  ceFAZolin (ANCEF) IVPB 2g/100 mL premix  Status:  Discontinued     2 g 200 mL/hr over 30 Minutes Intravenous On call to O.R. 01/09/16 0809 01/09/16 0810   01/09/16 0809  ceFAZolin (ANCEF) IVPB 2g/100 mL premix     2 g 200 mL/hr over 30 Minutes Intravenous On call to O.R. 01/09/16 0809 01/09/16 1030      Assessment/Plan: s/p Procedure(s): INJECTION BLUE DYE RIGHT BREAST,  TOTAL MASTECTOMY WITH  RIGHT AXILLARY SENTINEL NODE BIOPSY (Right) BREAST RECONSTRUCTION WITH PLACEMENT OF TISSUE EXPANDER AND FLEX HD (ACELLULAR HYDRATED DERMIS) (Right)  Right expander filled further to try to help with venous oozing. Will plan to repeat labs at noon today and again in the am . Continue to monitor JP  OP closely.     RAYBURN,SHAWN,PA-C Plastic Surgery [phone]

## 2016-01-10 NOTE — Progress Notes (Signed)
Npo

## 2016-01-10 NOTE — Progress Notes (Signed)
Patient ID: [name], female   DOB: 1966/05/15, 50 y.o.   MRN: [id number] I was notified by RN of her JP output being 320cc since 2000 last night. S/P R mastectomy, SLN BX, tissue expander. On exam, vitals OK, no large hematoma. JP output is dark blood. Ace wrap had been added and binder is in place. I spoke with Dr. Marla Roe from plastic surgery who did her tissue expander/reconstruction. She plans to come see her and possibly add fluid to the tissue expander to help tamponade the apparent venous ooze. Additionally, CBC ordered.  Georganna Skeans, MD, MPH, FACS Trauma: [phone] General Surgery: [phone]

## 2016-01-10 NOTE — Progress Notes (Signed)
01/10/16  0845  Spoke with Dr Darrel Hoover partner Dr Malachi Paradise regarding patient nose began to bleed.  Per MD continue to monitor nose bleed. Patient is not on blood thinners.

## 2016-01-11 ENCOUNTER — Encounter (HOSPITAL_COMMUNITY): Payer: Self-pay | Admitting: Physician Assistant

## 2016-01-11 DIAGNOSIS — C50111 Malignant neoplasm of central portion of right female breast: Secondary | ICD-10-CM | POA: Diagnosis not present

## 2016-01-11 LAB — BASIC METABOLIC PANEL
ANION GAP: 6 (ref 5–15)
BUN: 13 mg/dL (ref 6–20)
CHLORIDE: 101 mmol/L (ref 101–111)
CO2: 32 mmol/L (ref 22–32)
Calcium: 9.1 mg/dL (ref 8.9–10.3)
Creatinine, Ser: 0.62 mg/dL (ref 0.44–1.00)
GFR calc non Af Amer: >60 mL/min (ref >60)
Glucose, Bld: 136 mg/dL — ABNORMAL HIGH (ref 65–99)
POTASSIUM: 4.8 mmol/L (ref 3.5–5.1)
SODIUM: 139 mmol/L (ref 135–145)

## 2016-01-11 LAB — URINE CULTURE: Culture: NO GROWTH

## 2016-01-11 LAB — CBC
HEMATOCRIT: 33.8 % — AB (ref 36.0–46.0)
HEMOGLOBIN: 11.2 g/dL — AB (ref 12.0–15.0)
MCH: 31.5 pg (ref 26.0–34.0)
MCHC: 33.1 g/dL (ref 30.0–36.0)
MCV: 94.9 fL (ref 78.0–100.0)
Platelets: 139 10*3/uL — ABNORMAL LOW (ref 150–400)
RBC: 3.56 MIL/uL — ABNORMAL LOW (ref 3.87–5.11)
RDW: 12.8 % (ref 11.5–15.5)
WBC: 11.9 10*3/uL — ABNORMAL HIGH (ref 4.0–10.5)

## 2016-01-11 LAB — PROTIME-INR
INR: 1.42 (ref 0.00–1.49)
PROTHROMBIN TIME: 17.4 s — AB (ref 11.6–15.2)

## 2016-01-11 MED ORDER — SENNA 8.6 MG PO TABS: 1.0000 | ORAL_TABLET | Freq: Two times a day (BID) | ORAL | Status: DC

## 2016-01-11 MED ORDER — DIPHENHYDRAMINE HCL 12.5 MG/5ML PO ELIX: 12.5000 mg | ORAL_SOLUTION | Freq: Four times a day (QID) | ORAL | Status: DC | PRN

## 2016-01-11 MED ORDER — POLYETHYLENE GLYCOL 3350 17 G PO PACK: 17.0000 g | PACK | Freq: Every day | ORAL | Status: DC | PRN

## 2016-01-11 MED ORDER — OXYCODONE HCL 5 MG PO TABS: 5.0000 mg | ORAL_TABLET | Freq: Four times a day (QID) | ORAL | Status: DC | PRN

## 2016-01-11 MED ORDER — CEPHALEXIN 500 MG PO CAPS: 500.0000 mg | ORAL_CAPSULE | Freq: Four times a day (QID) | ORAL | Status: DC

## 2016-01-11 MED ORDER — OXYCODONE HCL ER 10 MG PO T12A: 10.0000 mg | EXTENDED_RELEASE_TABLET | Freq: Two times a day (BID) | ORAL | Status: DC

## 2016-01-11 NOTE — Discharge Instructions (Addendum)
The final pathology report should be available by Tuesday afternoon or Wednesday. Somewhat in Dr. Darrel Hoover office will call this report to you.  If you do not hear by Wednesday at noon, please call the office.  Contact your primary care doctor's office concerning your blood pressure medications and do not resume until you have contacted primary care for instructions on how to take and when to resume  Wear the Ace wrap and breast binder at all times except for hygiene. You may need to re-wrap the Ace wrap several times daily and then apply the breast binder.   Please record your drainage from your drain whenever you empty it.

## 2016-01-11 NOTE — Progress Notes (Signed)
2 Days Post-Op  Subjective: Still quite sore.  Has been walking around room using walker.  Has chronic vertigo but does not using anything at home when walking  Objective: Vital signs in last 24 hours: Temp:  [97.9 F (36.6 C)-98.7 F (37.1 C)] 97.9 F (36.6 C) (05/27 0505) Pulse Rate:  [62-76] 62 (05/27 0505) Resp:  [18-20] 18 (05/26 1628) BP: (106-116)/(40-58) 116/58 mmHg (05/27 0505) SpO2:  [96 %-98 %] 96 % (05/27 0505) Last BM Date: 01/09/16  Intake/Output from previous day: 05/26 0701 - 05/27 0700 In: 1351.7 [P.O.:120; I.V.:1131.7; IV Piggyback:100] Out: 2400 [Urine:2150; Drains:250] Intake/Output this shift:    PE: General- In NAD Right chest wall flap looks viable, thin serosanguinous drain output  Lab Results:   Recent Labs  01/10/16 1145 01/11/16 0048  WBC 17.5* 11.9*  HGB 11.6* 11.2*  HCT 34.2* 33.8*  PLT 171 139*   BMET  Recent Labs  01/10/16 1145 01/11/16 0048  NA 134* 139  K 4.4 4.8  CL 100* 101  CO2 26 32  GLUCOSE 148* 136*  BUN 12 13  CREATININE 0.67 0.62  CALCIUM 9.2 9.1   PT/INR  Recent Labs  01/10/16 1145 01/11/16 0048  LABPROT 16.9* 17.4*  INR 1.36 1.42   Comprehensive Metabolic Panel:    Component Value Date/Time   NA 139 01/11/2016 0048   NA 134* 01/10/2016 1145   NA 138 11/21/2015 1433   NA 138 11/13/2015 1211   K 4.8 01/11/2016 0048   K 4.4 01/10/2016 1145   K 3.9 11/21/2015 1433   K 3.9 11/13/2015 1211   CL 101 01/11/2016 0048   CL 100* 01/10/2016 1145   CO2 32 01/11/2016 0048   CO2 26 01/10/2016 1145   CO2 26 11/21/2015 1433   CO2 28 11/13/2015 1211   BUN 13 01/11/2016 0048   BUN 12 01/10/2016 1145   BUN 9.9 11/21/2015 1433   BUN 12.3 11/13/2015 1211   CREATININE 0.62 01/11/2016 0048   CREATININE 0.67 01/10/2016 1145   CREATININE 0.7 11/21/2015 1433   CREATININE 0.7 11/13/2015 1211   CREATININE 0.64 02/15/2013 0950   CREATININE 0.56 01/19/2013 1105   GLUCOSE 136* 01/11/2016 0048   GLUCOSE 148*  01/10/2016 1145   GLUCOSE 115 11/21/2015 1433   GLUCOSE 137 11/13/2015 1211   CALCIUM 9.1 01/11/2016 0048   CALCIUM 9.2 01/10/2016 1145   CALCIUM 9.3 11/21/2015 1433   CALCIUM 9.0 11/13/2015 1211   AST 67* 01/02/2016 1025   AST 114* 11/21/2015 1433   AST 104* 11/13/2015 1211   AST 52* 02/15/2013 0950   ALT 46 01/02/2016 1025   ALT 68* 11/21/2015 1433   ALT 72* 11/13/2015 1211   ALT 39* 02/15/2013 0950   ALKPHOS 83 01/02/2016 1025   ALKPHOS 95 11/21/2015 1433   ALKPHOS 93 11/13/2015 1211   ALKPHOS 77 02/15/2013 0950   BILITOT 1.2 01/02/2016 1025   BILITOT 1.47* 11/21/2015 1433   BILITOT 1.32* 11/13/2015 1211   BILITOT 0.8 02/15/2013 0950   PROT 7.6 01/02/2016 1025   PROT 7.9 11/21/2015 1433   PROT 7.7 11/13/2015 1211   PROT 7.3 02/15/2013 0950   ALBUMIN 3.6 01/02/2016 1025   ALBUMIN 3.7 11/21/2015 1433   ALBUMIN 3.5 11/13/2015 1211   ALBUMIN 4.0 02/15/2013 0950     Studies/Results: Nm Sentinel Node Inj-no Rpt (breast)  01/09/2016  CLINICAL DATA: cancer right breast Sulfur colloid was injected intradermally by the nuclear medicine technologist for breast cancer sentinel node localization.  Anti-infectives: Anti-infectives    Start     Dose/Rate Route Frequency Ordered Stop   01/09/16 1600  ceFAZolin (ANCEF) IVPB 2g/100 mL premix     2 g 200 mL/hr over 30 Minutes Intravenous Every 8 hours 01/09/16 1454     01/09/16 1158  polymyxin B 500,000 Units, bacitracin 50,000 Units in sodium chloride irrigation 0.9 % 500 mL irrigation  Status:  Discontinued       As needed 01/09/16 1158 01/09/16 1257   01/09/16 0809  ceFAZolin (ANCEF) IVPB 2g/100 mL premix  Status:  Discontinued     2 g 200 mL/hr over 30 Minutes Intravenous On call to O.R. 01/09/16 0809 01/09/16 0810   01/09/16 0809  ceFAZolin (ANCEF) IVPB 2g/100 mL premix     2 g 200 mL/hr over 30 Minutes Intravenous On call to O.R. 01/09/16 0809 01/09/16 1030      Assessment   TOTAL MASTECTOMY WITH RIGHT AXILLARY  SENTINEL NODE BIOPSY (Right) BREAST RECONSTRUCTION WITH PLACEMENT OF TISSUE EXPANDER AND FLEX HD (ACELLULAR HYDRATED DERMIS) on 01/09/16-INR normal, hemoglobin stable; still with significant drain output     Plan: Home when ready from [place] standpoint.   Wladyslaw Henrichs Lenna Sciara 01/11/2016

## 2016-01-11 NOTE — Progress Notes (Signed)
2 Days Post-Op  Subjective: Patient very anxious about discharge to home. Wants a rolling walker and we discussed using this very short term at home.  The right breast flap is viable and without evidence of hematoma.  The JP drainage has been very minimal overnight and was 250 ml/24 hr.  UOP has been good. Hgb/plts minimally decreased. INR stable.  UA negative and wbc down.  BP/HR stable. She has not been taking her BP meds while here and will need to contact her PCP about dosing and follow up for this medication.   Objective: Vital signs in last 24 hours: Temp:  [97.9 F (36.6 C)-98.7 F (37.1 C)] 98.1 F (36.7 C) (05/27 1000) Pulse Rate:  [62-78] 75 (05/27 1000) Resp:  [18] 18 (05/27 1000) BP: (106-131)/(40-58) 119/49 mmHg (05/27 1000) SpO2:  [94 %-99 %] 99 % (05/27 1000) Last BM Date: 01/09/16  Intake/Output from previous day: 05/26 0701 - 05/27 0700 In: 1351.7 [P.O.:120; I.V.:1131.7; IV Piggyback:100] Out: 2400 [Urine:2150; Drains:250] Intake/Output this shift: Total I/O In: -  Out: 400 [Urine:400]  General appearance: alert, cooperative, mild distress and anxious Right breast flap is viable, minimal bruising and no hematoma. JP drainage is sanguinous as expected   Lab Results:   Recent Labs  01/10/16 1145 01/11/16 0048  WBC 17.5* 11.9*  HGB 11.6* 11.2*  HCT 34.2* 33.8*  PLT 171 139*   BMET  Recent Labs  01/10/16 1145 01/11/16 0048  NA 134* 139  K 4.4 4.8  CL 100* 101  CO2 26 32  GLUCOSE 148* 136*  BUN 12 13  CREATININE 0.67 0.62  CALCIUM 9.2 9.1   PT/INR  Recent Labs  01/10/16 1145 01/11/16 0048  LABPROT 16.9* 17.4*  INR 1.36 1.42   ABG No results for input(s): PHART, HCO3 in the last 72 hours.  Invalid input(s): PCO2, PO2  Studies/Results: No results found.  Anti-infectives: Anti-infectives    Start     Dose/Rate Route Frequency Ordered Stop   01/09/16 1600  ceFAZolin (ANCEF) IVPB 2g/100 mL premix     2 g 200 mL/hr over 30 Minutes  Intravenous Every 8 hours 01/09/16 1454     01/09/16 1158  polymyxin B 500,000 Units, bacitracin 50,000 Units in sodium chloride irrigation 0.9 % 500 mL irrigation  Status:  Discontinued       As needed 01/09/16 1158 01/09/16 1257   01/09/16 0809  ceFAZolin (ANCEF) IVPB 2g/100 mL premix  Status:  Discontinued     2 g 200 mL/hr over 30 Minutes Intravenous On call to O.R. 01/09/16 0809 01/09/16 0810   01/09/16 0809  ceFAZolin (ANCEF) IVPB 2g/100 mL premix     2 g 200 mL/hr over 30 Minutes Intravenous On call to O.R. 01/09/16 0809 01/09/16 1030      Assessment/Plan: s/p Procedure(s): INJECTION BLUE DYE RIGHT BREAST,  TOTAL MASTECTOMY WITH  RIGHT AXILLARY SENTINEL NODE BIOPSY (Right) BREAST RECONSTRUCTION WITH PLACEMENT OF TISSUE EXPANDER AND FLEX HD (ACELLULAR HYDRATED DERMIS) (Right) Patient is ready for discharge, but needs to mobilize some today before discharge  Will plan to see earlier next week for follow up.      RAYBURN,SHAWN,PA-C Plastic Surgery [phone]

## 2016-01-11 NOTE — Progress Notes (Signed)
Discharge papers gone over with pt. And her husband. All prescriptions given to her husband. Taught pt. And her husband about JP drain: emptying/recording. Pt.'s husband did return demonstration and did fine. I provided pt. With some extra gauze, abdominal pads, and gloves. NO questions/complaints. IV taken out. Pt. D/c'd successfully via w/c.

## 2016-01-11 NOTE — Care Management Note (Signed)
Case Management Note  Patient Details  Name: Navah Grondin MRN: [id number] Date of Birth: August 17, 1966  Subjective/Objective: 50 yo F with R breast CA. She underwent R immediate breast reconstruction with placement of Acellular Dermal Matrix and tissue expanders.     Action/Plan: received referral to assist with DME (RW)   Expected Discharge Date:   01/11/16               Expected Discharge Plan:  Home/Self Care  In-House Referral:     Discharge planning Services  CM Consult  Post Acute Care Choice:    Choice offered to:     DME Arranged:  Walker rolling DME Agency:  [place]:    [place]:     Status of Service:  Completed, signed off  Medicare Important Message Given:    Date Medicare IM Given:    Medicare IM give by:    Date Additional Medicare IM Given:    Additional Medicare Important Message give by:     If discussed at [place] of Stay Meetings, dates discussed:    Additional Comments: met with pt at bedside. She plans to return home with the support of her husband. She needs a RW. Mellody Dance at [facility] for DME referral.  Norina Buzzard, RN 01/11/2016, 1:03 PM

## 2016-01-13 ENCOUNTER — Encounter (HOSPITAL_COMMUNITY): Payer: Self-pay | Admitting: General Surgery

## 2016-01-15 NOTE — Progress Notes (Signed)
Quick Note:  Inform patient of Pathology report,. I called her with pathology report results this morning.  hmi ______

## 2016-01-15 NOTE — Discharge Summary (Signed)
Physician Discharge Summary  Patient ID: [name] MRN: [id number] DOB/AGE: Jun 15, 1966 50 y.o.  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 She underwent the procedures without complication. Post operatively she developed high JP drain output and mild acute blood loss anemia. She has a history of cirrhosis. The expander was filled further and the output dramatically improved following the fill of the expander. She was monitored overnight following the additional fill and her output remained significantly lower. Her Hgb/INR was stable in follow up and platelets minimally decreased. She was hemodynamically stable through out.  She was discharged home in stable condition.   Treatments: surgery:   Right total mastectomy                                      Right axillary sentinel lymph node biopsy EXPANDER: Right - [place]  300cc. High profile (250 cc of saline placed) Acellular Dermal Matrix - FlexHD 4 x 16 cm  Expander filled further on 01/10/16 with 50 ml of sterile injectable saline for total of 300/300 ml    Discharge Exam: Blood pressure 119/49, pulse 75, temperature 98.1 F (36.7 C), temperature source Oral, resp. rate 18, height '5\' 6"'  (1.676 m), weight 100.064 kg (220 lb 9.6 oz), SpO2 99 %. General appearance: alert, cooperative, no distress and anxious Resp: clear to auscultation bilaterally Cardio: regular rate and rhythm the right breast flap is viable and without signs of hematoma or infection. JP drainage is  serosanguinous and minimal  Disposition: 01-Home or Self Care  Discharge Instructions    Nursing communication    Complete by:  As directed   Please instruct patient and caregivers in how to empty JP drain and send with sheet to record volumes. They will likely need to empty several times daily     Walker rolling    Complete by:  As directed             Medication List    STOP taking these medications        losartan-hydrochlorothiazide 100-25 MG tablet  Commonly known as:  HYZAAR     Turmeric 500 MG Caps      TAKE these medications        cephALEXin 500 MG capsule  Commonly known as:  KEFLEX  Take 1 capsule (500 mg total) by mouth 4 (four) times daily.     citalopram 20 MG tablet  Commonly known as:  CELEXA  Take 1 tablet by mouth daily.     diphenhydrAMINE 12.5 MG/5ML elixir  Commonly known as:  BENADRYL  Take 5 mLs (12.5 mg total) by mouth every 6 (six) hours as needed for itching.     LORazepam 1 MG tablet  Commonly known as:  ATIVAN  Take 1 tablet by mouth every 6 (six) hours as needed.     multivitamin with minerals tablet  Take 1 tablet by mouth daily.     oxyCODONE 5 MG immediate release tablet  Commonly known as:  Oxy IR/ROXICODONE  Take 1 tablet (5 mg total) by mouth every 6 (six) hours as needed for moderate pain, severe pain or breakthrough pain.     oxyCODONE 10 mg 12 hr tablet  Commonly known as:  OXYCONTIN  Take 1 tablet (10 mg total) by mouth every 12 (twelve) hours.     polyethylene glycol packet  Commonly known as:  MIRALAX / GLYCOLAX  Take 17 g by mouth daily as needed for mild constipation.     senna 8.6 MG Tabs tablet  Commonly known as:  SENOKOT  Take 1 tablet (8.6 mg total) by mouth 2 (two) times daily.           Follow-up Information    Follow up with [name], MD. Schedule an appointment as soon as possible for a visit in 3 weeks.   Specialty:  General Surgery   Contact information:   [address] [place] [address] [phone]       Follow up with Wallace Going, DO On 01/14/2016.   Specialty:  Plastic Surgery   Why:  Please come in for follow up on Tuesday at 1:20 pm   Contact information:   [address] [place] [address] [phone]       Signed: [facility] Plastic Surgery [phone]

## 2016-01-16 ENCOUNTER — Encounter: Payer: Self-pay | Admitting: Hematology and Oncology

## 2016-01-16 ENCOUNTER — Ambulatory Visit (HOSPITAL_BASED_OUTPATIENT_CLINIC_OR_DEPARTMENT_OTHER): Payer: Managed Care, Other (non HMO) | Admitting: Hematology and Oncology

## 2016-01-16 VITALS — BP 121/51 | HR 85 | Temp 98.6°F | Resp 18 | Wt 214.7 lb

## 2016-01-16 DIAGNOSIS — C50411 Malignant neoplasm of upper-outer quadrant of right female breast: Secondary | ICD-10-CM

## 2016-01-16 DIAGNOSIS — Z170 Estrogen receptor positive status [ER+]: Secondary | ICD-10-CM | POA: Diagnosis not present

## 2016-01-16 NOTE — Assessment & Plan Note (Signed)
Right mastectomy 01/09/2016: IDC, 2 tumors, 3.2 cm and 2.2 cm, 2/3 sentinel nodes positive, focal extranodal extension, 2 additional nodes negative, ER 100%, PR 90%, HER-2 negative ratio 1.16, Ki-67 10% and 7%, T2(multifocal) N1 a stage IIB  Pathology counseling: I discussed the final pathology report of the patient provided  a copy of this report. I discussed the margins as well as lymph node surgeries. We also discussed the final staging along with previously performed ER/PR and HER-2/neu testing.  Recommendation: 1. Mammaprint testing to determine if she would benefit from chemotherapy 2. Adjuvant radiation therapy 3. Followed by adjuvant antiestrogen therapy   Mammaprint counseling: MINDACT is a prospective, randomized phase III controlled trial that investigates the clinical utility of MammaPrint, when compared to standard clinical pathological criteria, with 6,693 patients enrolled from over 111 institutions. Clinical high-risk patients with a Low Risk MammaPrint result, including 48% node-positive, had 5-year distant metastasis-free survival rate in excess of 94 percent, whether randomized to receive adjuvant chemotherapy or not proving MammaPrint's ability to safely identify Low Risk patients.  Return to clinic based upon Mammaprint test results

## 2016-01-16 NOTE — Progress Notes (Signed)
Patient Care Team: Shirline Frees, MD as PCP - General (Family Medicine) Bo Merino, MD as Referring Physician (Rheumatology) Sylvan Cheese, NP as Nurse Practitioner (Hematology and Oncology)  DIAGNOSIS: Breast cancer of upper-outer quadrant of right female breast [facility])   Staging form: Breast, AJCC 7th Edition     Clinical stage from 11/13/2015: Stage IIA (T2, N0, M0) - Unsigned       Staging comments: Staged at breast conference on 3.29.17    SUMMARY OF ONCOLOGIC HISTORY:   Breast cancer of upper-outer quadrant of right female breast ([place])   11/07/2015 Initial Diagnosis Right breast biopsy 12:30 position: Invasive ductal carcinoma grade 2, ER/PR positive, HER-2 negative ratio 1.17, Ki-67 7%, ultrasound revealed 2.7 cm mass, T2 N0 stage II a clinical stage   01/09/2016 Surgery Right mastectomy: IDC, 2 tumors, 3.2 cm and 2.2 cm, 2/3 sentinel nodes positive, focal extranodal extension, 2 additional nodes negative, ER 100%, PR 90%, HER-2 negative ratio 1.16, Ki-67 10% and 7%, T2(multifocal) N1 a stage IIB    CHIEF COMPLIANT: Follow-up after right mastectomy  INTERVAL HISTORY: [name] is a 50 year old with above-mentioned is red breast cancer who underwent right mastectomy for multifocal disease and she is here today to discuss the pathology report. She is recovering very well from the surgery. She continues to have moderate degree of discomfort. She is trying to wean herself off narcotic pain medications. She had tissue expander placed during mastectomy surgery.  REVIEW OF SYSTEMS:   Constitutional: Denies fevers, chills or abnormal weight loss Eyes: Denies blurriness of vision Ears, nose, mouth, throat, and face: Denies mucositis or sore throat Respiratory: Denies cough, dyspnea or wheezes Cardiovascular: Denies palpitation, chest discomfort Gastrointestinal:  Denies nausea, heartburn or change in bowel habits Skin: Denies abnormal skin rashes Lymphatics: Denies  new lymphadenopathy or easy bruising Neurological:Denies numbness, tingling or new weaknesses Behavioral/Psych: Mood is stable, no new changes  Extremities: No lower extremity edema Breast: Recent right mastectomy All other systems were reviewed with the patient and are negative.  I have reviewed the past medical history, past surgical history, social history and family history with the patient and they are unchanged from previous note.  ALLERGIES:  is allergic to xanax and tape.  MEDICATIONS:  Current Outpatient Prescriptions  Medication Sig Dispense Refill  . cephALEXin (KEFLEX) 500 MG capsule Take 1 capsule (500 mg total) by mouth 4 (four) times daily. 28 capsule 0  . citalopram (CELEXA) 20 MG tablet Take 1 tablet by mouth daily.    . diphenhydrAMINE (BENADRYL) 12.5 MG/5ML elixir Take 5 mLs (12.5 mg total) by mouth every 6 (six) hours as needed for itching. 120 mL 0  . LORazepam (ATIVAN) 1 MG tablet Take 1 tablet by mouth every 6 (six) hours as needed.    . Multiple Vitamins-Minerals (MULTIVITAMIN WITH MINERALS) tablet Take 1 tablet by mouth daily.    Marland Kitchen oxyCODONE (OXY IR/ROXICODONE) 5 MG immediate release tablet Take 1 tablet (5 mg total) by mouth every 6 (six) hours as needed for moderate pain, severe pain or breakthrough pain. 40 tablet 0  . oxyCODONE (OXYCONTIN) 10 mg 12 hr tablet Take 1 tablet (10 mg total) by mouth every 12 (twelve) hours. 20 tablet 0  . polyethylene glycol (MIRALAX / GLYCOLAX) packet Take 17 g by mouth daily as needed for mild constipation. 14 each 0  . senna (SENOKOT) 8.6 MG TABS tablet Take 1 tablet (8.6 mg total) by mouth 2 (two) times daily. 120 each 0   No current  facility-administered medications for this visit.    PHYSICAL EXAMINATION: ECOG PERFORMANCE STATUS: 1 - Symptomatic but completely ambulatory  Filed Vitals:   01/16/16 1145  BP: 121/51  Pulse: 85  Temp: 98.6 F (37 C)  Resp: 18   Filed Weights   01/16/16 1145  Weight: 214 lb 11.2 oz  (97.387 kg)    GENERAL:alert, no distress and comfortable SKIN: skin color, texture, turgor are normal, no rashes or significant lesions EYES: normal, Conjunctiva are pink and non-injected, sclera clear OROPHARYNX:no exudate, no erythema and lips, buccal mucosa, and tongue normal  NECK: supple, thyroid normal size, non-tender, without nodularity LYMPH:  no palpable lymphadenopathy in the cervical, axillary or inguinal LUNGS: clear to auscultation and percussion with normal breathing effort HEART: regular rate & rhythm and no murmurs and no lower extremity edema ABDOMEN:abdomen soft, non-tender and normal bowel sounds MUSCULOSKELETAL:no cyanosis of digits and no clubbing  NEURO: alert & oriented x 3 with fluent speech, no focal motor/sensory deficits EXTREMITIES: No lower extremity edema  LABORATORY DATA:  I have reviewed the data as listed   Chemistry      Component Value Date/Time   NA 139 01/11/2016 0048   NA 138 11/21/2015 1433   K 4.8 01/11/2016 0048   K 3.9 11/21/2015 1433   CL 101 01/11/2016 0048   CO2 32 01/11/2016 0048   CO2 26 11/21/2015 1433   BUN 13 01/11/2016 0048   BUN 9.9 11/21/2015 1433   CREATININE 0.62 01/11/2016 0048   CREATININE 0.7 11/21/2015 1433   CREATININE 0.64 02/15/2013 0950      Component Value Date/Time   CALCIUM 9.1 01/11/2016 0048   CALCIUM 9.3 11/21/2015 1433   ALKPHOS 83 01/02/2016 1025   ALKPHOS 95 11/21/2015 1433   AST 67* 01/02/2016 1025   AST 114* 11/21/2015 1433   ALT 46 01/02/2016 1025   ALT 68* 11/21/2015 1433   BILITOT 1.2 01/02/2016 1025   BILITOT 1.47* 11/21/2015 1433       Lab Results  Component Value Date   WBC 11.9* 01/11/2016   HGB 11.2* 01/11/2016   HCT 33.8* 01/11/2016   MCV 94.9 01/11/2016   PLT 139* 01/11/2016   NEUTROABS 3.8 01/02/2016     ASSESSMENT & PLAN:  Breast cancer of upper-outer quadrant of right female breast ([place]) Right mastectomy 01/09/2016: IDC, 2 tumors, 3.2 cm and 2.2 cm, 2/3 sentinel nodes  positive, focal extranodal extension, 2 additional nodes negative, ER 100%, PR 90%, HER-2 negative ratio 1.16, Ki-67 10% and 7%, T2(multifocal) N1 a stage IIB  Pathology counseling: I discussed the final pathology report of the patient provided  a copy of this report. I discussed the margins as well as lymph node surgeries. We also discussed the final staging along with previously performed ER/PR and HER-2/neu testing.  Recommendation: 1. Mammaprint testing to determine if she would benefit from chemotherapy 2. Adjuvant radiation therapy 3. Followed by adjuvant antiestrogen therapy   Mammaprint counseling: MINDACT is a prospective, randomized phase III controlled trial that investigates the clinical utility of MammaPrint, when compared to standard clinical pathological criteria, with 6,693 patients enrolled from over 111 institutions. Clinical high-risk patients with a Low Risk MammaPrint result, including 48% node-positive, had 5-year distant metastasis-free survival rate in excess of 94 percent, whether randomized to receive adjuvant chemotherapy or not proving MammaPrint's ability to safely identify Low Risk patients.  Return to clinic based upon Mammaprint test results     No orders of the defined types were placed in this  encounter.   The patient has a good understanding of the overall plan. she agrees with it. she will call with any problems that may develop before the next visit here.   Rulon Eisenmenger, MD 01/16/2016

## 2016-01-27 ENCOUNTER — Encounter (HOSPITAL_COMMUNITY): Payer: Self-pay

## 2016-01-27 ENCOUNTER — Telehealth: Payer: Self-pay

## 2016-01-27 NOTE — Telephone Encounter (Signed)
Received Mammaprint results.  Notified pt of low risk score.  Informed pt her next step will be for radiation with Dr. Sondra Come.  Referral entered.  Per Dr. Lindi Adie, he wishes to follow up with pt in about 2.5 months.  Pt excited to hear this news and has no further questions at time of call.  POF entered for pt to be scheduled with Dr. Lindi Adie.

## 2016-01-31 NOTE — Progress Notes (Signed)
Location of Breast Cancer: Breast cancer of upper-outer quadrant of right female breast   Histology per Pathology Report:  01/09/16 Diagnosis 1. Breast, simple mastectomy, Right Total - INVASIVE DUCTAL CARCINOMA, TWO TUMORS, 3.2 AND 2.2 CM. - MARGINS NOT INVOLVED. - FIBROCYSTIC CHANGES. - TWO BENIGN LYMPH NODES (0/2). 2. Lymph node, sentinel, biopsy, Right #1 - METASTATIC CARCINOMA IN ONE LYMPH NODE (1/1). 3. Lymph node, sentinel, biopsy, Right #2 - METASTATIC CARCINOMA IN ONE LYMPH NODE WITH FOCAL EXTRANODAL EXTENSION (1/1). 4. Lymph node, sentinel, biopsy, Right #3 - ONE BENIGN LYMPH NODE (0/1). 5. Breast, excision, Right Anterior Margin - BENIGN ADIPOSE TISSUE. - NO EVIDENCE OF MALIGNANCY.  11/19/15 Diagnosis 1. Breast, left, needle core biopsy, LOQ - FIBROCYSTIC CHANGES WITH ADENOSIS AND CALCIFICATIONS. - THERE IS NO EVIDENCE OF MALIGNANCY. - SEE COMMENT. 2. Breast, right, needle core biopsy, UIQ - INVASIVE DUCTAL CARCINOMA. - SEE COMMENT.  Receptor Status: ER(100%), PR (90%), Her2-neu (negative), Ki-(10%)  Did patient present with symptoms (if so, please note symptoms) or was this found on screening mammography?: palpable mass in the right breast  Past/Anticipated interventions by surgeon, if any: Procedure: INJECTION BLUE DYE RIGHT BREAST,  TOTAL MASTECTOMY WITH  RIGHT AXILLARY SENTINEL NODE BIOPSY;  Surgeon: Fanny Skates, MD, procedure: BREAST RECONSTRUCTION WITH PLACEMENT OF TISSUE EXPANDER AND FLEX HD (ACELLULAR HYDRATED DERMIS);  Surgeon: Loel Lofty Dillingham, DO  Past/Anticipated interventions by medical oncology, if any: adjuvant antiestrogen therapy   Lymphedema issues, if any:  no    Pain issues, if any:  yes - patient has psoriatic arthritis and has not been able to start her medication.  OB GYN history: She menarched at early age of 14 and is uncertain regarding menopause age because she had a hysterectomy. She had no pregnancies, she adopted a daughter. She  has not received birth control pills.  She was never exposed to fertility medications or hormone replacement therapy. She has no family history of Breast/GYN cancer.  SAFETY ISSUES:  Prior radiation? no  Pacemaker/ICD? no  Possible current pregnancy?no  Is the patient on methotrexate? no  Current Complaints / other details:  Patient reports she had "a fill" yesterday for her implant.  She is wondering if she needs surgery first with Dr. Marla Roe before radiation.  BP 123/59 mmHg  Pulse 75  Temp(Src) 98.6 F (37 C) (Oral)  Ht _0  (1.676 m)  Wt 190 lb 1.6 oz (86.229 kg)  BMI 30.70 kg/m2  SpO2 100%    Jacqulyn Liner, RN 01/31/2016,9:01 AM

## 2016-02-05 ENCOUNTER — Encounter: Payer: Self-pay | Admitting: Radiation Oncology

## 2016-02-05 ENCOUNTER — Ambulatory Visit
Admission: RE | Admit: 2016-02-05 | Discharge: 2016-02-05 | Disposition: A | Discharge status: Home / Self Care | Payer: Managed Care, Other (non HMO) | Source: Ambulatory Visit | Attending: Radiation Oncology | Admitting: Radiation Oncology

## 2016-02-05 VITALS — BP 123/59 | HR 75 | Temp 98.6°F | Ht 66.0 in | Wt 190.1 lb

## 2016-02-05 DIAGNOSIS — C50411 Malignant neoplasm of upper-outer quadrant of right female breast: Secondary | ICD-10-CM

## 2016-02-05 DIAGNOSIS — Z510 Encounter for antineoplastic radiation therapy: Secondary | ICD-10-CM | POA: Insufficient documentation

## 2016-02-05 DIAGNOSIS — C50911 Malignant neoplasm of unspecified site of right female breast: Secondary | ICD-10-CM | POA: Insufficient documentation

## 2016-02-05 DIAGNOSIS — Z9011 Acquired absence of right breast and nipple: Secondary | ICD-10-CM | POA: Insufficient documentation

## 2016-02-05 DIAGNOSIS — Z170 Estrogen receptor positive status [ER+]: Secondary | ICD-10-CM | POA: Diagnosis not present

## 2016-02-05 NOTE — Progress Notes (Signed)
Radiation Oncology         [phone] ________________________________  Name: [name] MRN: [id number]  Date: 02/05/2016  DOB: [birth year]  Re-Evaluation Visit Note  CC: Shirline Frees, MD  Nicholas Lose, MD    ICD-9-CM ICD-10-CM   1. 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 The patient was initially seen in breast multidisciplinary clinic on 11/13/15. MRI of the bilateral breasts on 11/14/15 revealed a known invasive carcinoma in the right breast measuring 2.9 x 2.9 x 2.7 cm, a 0.5 cm satellite mass immediately anterior to it, and a 0.9 x 0.8 x 0.9 cm irregular mass 2.5 cm at the middle third depth. Innumerable foci of enhancement was noted throughout the breast. The MRI also revealed an irregular mass measuring 1.0 x 1.2 x 0.8 cm in the 5 o'clock position and innumerable foci of enhancement in the left breast. Also of note was a 1.5 cm lymph node in the right axilla showing borderline cortical thickening.  CT of the abdomen and chest on 11/15/15 showed the known 2.4 cm right breast mass, a single 0.4 cm right middle lobe pulmonary nodule (most likely a benign intrapulmonary lymph node), and no osseous metastatic disease. Bone scan on 11/15/15 was also negative for malignancy.  Biopsy of the LOQ of the left breast on 11/19/15 revealed fibrocystic changes with adenosis and calcifications and no evidence of malignancy. Biopsy of the UIQ of the right breast the same day revealed grade 1 invasive ductal carcinoma (ER 100% positive, PR 90% positive, and Ki67 10%).  The patient underwent a right total simple mastectomy and sentinel lymph node biopsies on 01/09/16. This revealed two grade 1 invasive ductal carcinoma tumors measuring 3.2 and 2.2 cm (ER positive,  PR positive, HER2 negative). 2/3 sentinel lymph nodes biopsied were positive (one with focal extranodal extension) and 2 non-sentinel lymph nodes biopsied were negative. The patient had a right immediate breast reconstruction with placement of Acellular Dermal Matrix and tissue expanders.  The patient saw Dr. Lindi Adie on 01/16/16 who ordered Mammaprint testing. The risk of recurrence was low risk and the molecular subtype was luminal-type. The patient was notified of her score and chemotherapy was not recommended. She presents today, with her husband, to discuss the role of radiation in the management of her disease.  ROS: The patient denies numbness, tingling, or swelling of the right arm. She reports back and knee pain attributed to psoriatic arthritis. The patient was advised to stop taking Enbrel after her surgery for wound healing. She reports that she will be switched to [place].  ALLERGIES:  is allergic to xanax and tape.  Meds: Current Outpatient Prescriptions  Medication Sig Dispense Refill  . LORazepam (ATIVAN) 1 MG tablet Take 1 tablet by mouth every 6 (six) hours as needed.    . Multiple Vitamins-Minerals (MULTIVITAMIN WITH MINERALS) tablet Take 1 tablet by mouth daily.    . citalopram (CELEXA) 20 MG tablet Take 1 tablet by mouth daily. Reported on 02/05/2016    . diphenhydrAMINE (BENADRYL) 12.5 MG/5ML elixir Take 5 mLs (12.5 mg total) by mouth every 6 (six) hours as needed for itching. (Patient not taking: Reported on 02/05/2016) 120 mL 0  . oxyCODONE (OXY IR/ROXICODONE) 5 MG immediate release tablet Take 1 tablet (5 mg total) by mouth every  6 (six) hours as needed for moderate pain, severe pain or breakthrough pain. (Patient not taking: Reported on 02/05/2016) 40 tablet 0  . oxyCODONE (OXYCONTIN) 10 mg 12 hr tablet Take 1 tablet (10 mg total) by mouth every 12 (twelve) hours. (Patient not taking: Reported on 02/05/2016) 20 tablet 0  . polyethylene glycol (MIRALAX / GLYCOLAX) packet Take 17 g by  mouth daily as needed for mild constipation. (Patient not taking: Reported on 02/05/2016) 14 each 0  . senna (SENOKOT) 8.6 MG TABS tablet Take 1 tablet (8.6 mg total) by mouth 2 (two) times daily. (Patient not taking: Reported on 02/05/2016) 120 each 0   No current facility-administered medications for this encounter.    Physical Findings: The patient is in no acute distress. Patient is alert and oriented.  height is '5\' 6"'  (1.676 m) and weight is 190 lb 1.6 oz (86.229 kg). Her oral temperature is 98.6 F (37 C). Her blood pressure is 123/59 and her pulse is 75. Her oxygen saturation is 100%.   Lungs are clear to auscultation bilaterally. Heart has regular rate and rhythm. No palpable cervical, supraclavicular, or axillary adenopathy. Right chest shows reconstruction with a scar healing well with no sign of infection or wound breakdown. There was also a bandage in the upper part of the breast which was not removed.  Lab Findings: Lab Results  Component Value Date   WBC 11.9* 01/11/2016   HGB 11.2* 01/11/2016   HCT 33.8* 01/11/2016   MCV 94.9 01/11/2016   PLT 139* 01/11/2016    Radiographic Findings: Nm Sentinel Node Inj-no Rpt (breast)  01/09/2016  CLINICAL DATA: cancer right breast Sulfur colloid was injected intradermally by the nuclear medicine technologist for breast cancer sentinel node localization.   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 I spoke to the patient today regarding her diagnosis and options for treatment. We discussed the equivalence in terms of survival and local failure between mastectomy and breast conservation. We discussed the role of radiation in decreasing local failures in patients who undergo mastectomy and have risk factors for recurrence including positive lymph nodes. We discussed the process of CT simulation and the placement tattoos. We discussed 6 weeks of treatment as an outpatient to the right chest  wall and right axillary and supraclavicular region. We discussed the possibility of asymptomatic lung damage. We discussed the low likelihood of secondary malignancies. We discussed the possible side effects including but not limited to skin redness, fatigue, permanent skin darkening, and chest wall swelling. We discussed increased complications that can occur with reconstruction after radiation.  Plan: I will have a discussion with Dr. Marla Roe regarding sequencing of reconstruction surgery and post-mastectomy radiation. I will also contact Dr. Estanislado Pandy, the patient's rheumatologist, to determine when the patient will return to taking her rheumatoid medications and make a referral to physical therapy for the patient's shoulder and arm mobility issues and lymphedema prevention. ____________________________________ -----------------------------------  Blair Promise, PhD, MD  This document serves as a record of services personally performed by Gery Pray, MD. It was created on his behalf by Darcus Austin, a trained medical scribe. The creation of this record is based on the scribe's personal observations and the provider's statements to them. This document has been checked and approved by the attending provider.

## 2016-02-05 NOTE — Progress Notes (Signed)
Please see the Nurse Progress Note in the MD Initial Consult Encounter for this patient. 

## 2016-02-17 ENCOUNTER — Encounter: Payer: Self-pay | Admitting: *Deleted

## 2016-02-17 NOTE — Progress Notes (Signed)
[place] Psychosocial Distress Screening Clinical Social Work  Clinical Social Work was referred by distress screening protocol.  The patient scored a 5 on the Psychosocial Distress Thermometer which indicates moderate distress. Clinical Social Worker contacted patient at home to assess for distress and other psychosocial needs.  CSW left patient a messaged offering support and information on the support team and support services at [facility].  CSW encouraged patient to call with any questions or concerns.      ONCBCN DISTRESS SCREENING 02/05/2016  Screening Type Initial Screening  Distress experienced in past week (1-10) 5  Emotional problem type Nervousness/Anxiety  Physical Problem type Sleep/insomnia;Getting around   Johnnye Lana, MSW, LCSW, OSW-C Clinical Social Worker [facility] [phone]

## 2016-02-21 ENCOUNTER — Encounter (HOSPITAL_BASED_OUTPATIENT_CLINIC_OR_DEPARTMENT_OTHER): Payer: Self-pay | Admitting: *Deleted

## 2016-02-24 ENCOUNTER — Ambulatory Visit: Payer: Managed Care, Other (non HMO) | Admitting: Radiation Oncology

## 2016-02-26 ENCOUNTER — Other Ambulatory Visit: Payer: Self-pay | Admitting: Plastic Surgery

## 2016-02-26 DIAGNOSIS — Z9011 Acquired absence of right breast and nipple: Secondary | ICD-10-CM

## 2016-02-26 DIAGNOSIS — N651 Disproportion of reconstructed breast: Secondary | ICD-10-CM

## 2016-02-26 DIAGNOSIS — C50911 Malignant neoplasm of unspecified site of right female breast: Secondary | ICD-10-CM

## 2016-02-27 ENCOUNTER — Ambulatory Visit (HOSPITAL_BASED_OUTPATIENT_CLINIC_OR_DEPARTMENT_OTHER): Payer: Managed Care, Other (non HMO) | Admitting: Anesthesiology

## 2016-02-27 ENCOUNTER — Ambulatory Visit (HOSPITAL_BASED_OUTPATIENT_CLINIC_OR_DEPARTMENT_OTHER)
Admission: RE | Admit: 2016-02-27 | Discharge: 2016-02-27 | Disposition: A | Discharge status: Home / Self Care | Payer: Managed Care, Other (non HMO) | Source: Ambulatory Visit | Attending: Plastic Surgery | Admitting: Plastic Surgery

## 2016-02-27 ENCOUNTER — Encounter (HOSPITAL_BASED_OUTPATIENT_CLINIC_OR_DEPARTMENT_OTHER): Payer: Self-pay | Admitting: *Deleted

## 2016-02-27 ENCOUNTER — Encounter (HOSPITAL_BASED_OUTPATIENT_CLINIC_OR_DEPARTMENT_OTHER)
Admission: RE | Disposition: A | Discharge status: Home / Self Care | Payer: Self-pay | Source: Ambulatory Visit | Attending: Plastic Surgery

## 2016-02-27 DIAGNOSIS — F419 Anxiety disorder, unspecified: Secondary | ICD-10-CM | POA: Insufficient documentation

## 2016-02-27 DIAGNOSIS — Z90722 Acquired absence of ovaries, bilateral: Secondary | ICD-10-CM | POA: Diagnosis not present

## 2016-02-27 DIAGNOSIS — I10 Essential (primary) hypertension: Secondary | ICD-10-CM | POA: Insufficient documentation

## 2016-02-27 DIAGNOSIS — Z9011 Acquired absence of right breast and nipple: Secondary | ICD-10-CM | POA: Insufficient documentation

## 2016-02-27 DIAGNOSIS — Z90710 Acquired absence of both cervix and uterus: Secondary | ICD-10-CM | POA: Insufficient documentation

## 2016-02-27 DIAGNOSIS — N651 Disproportion of reconstructed breast: Secondary | ICD-10-CM | POA: Diagnosis not present

## 2016-02-27 DIAGNOSIS — Z853 Personal history of malignant neoplasm of breast: Secondary | ICD-10-CM | POA: Insufficient documentation

## 2016-02-27 DIAGNOSIS — C50911 Malignant neoplasm of unspecified site of right female breast: Secondary | ICD-10-CM

## 2016-02-27 HISTORY — PX: MASTOPEXY: SHX5358

## 2016-02-27 HISTORY — PX: REMOVAL OF TISSUE EXPANDER AND PLACEMENT OF IMPLANT: SHX6457

## 2016-02-27 SURGERY — REMOVAL, TISSUE EXPANDER, BREAST, WITH IMPLANT INSERTION
Anesthesia: General | Site: Breast | Laterality: Right

## 2016-02-27 MED ORDER — OXYCODONE HCL 5 MG/5ML PO SOLN: 5.0000 mg | Freq: Once | ORAL | Status: AC | PRN

## 2016-02-27 MED ORDER — ONDANSETRON HCL 4 MG/2ML IJ SOLN
INTRAMUSCULAR | Status: DC | PRN
  Administered 2016-02-27: 4 mg via INTRAVENOUS

## 2016-02-27 MED ORDER — CHLORHEXIDINE GLUCONATE CLOTH 2 % EX PADS: 6.0000 | MEDICATED_PAD | Freq: Once | CUTANEOUS | Status: DC

## 2016-02-27 MED ORDER — GLYCOPYRROLATE 0.2 MG/ML IJ SOLN: 0.2000 mg | Freq: Once | INTRAMUSCULAR | Status: DC | PRN

## 2016-02-27 MED ORDER — CEFAZOLIN SODIUM-DEXTROSE 2-4 GM/100ML-% IV SOLN
2.0000 g | INTRAVENOUS | Status: AC
  Administered 2016-02-27: 2 g via INTRAVENOUS

## 2016-02-27 MED ORDER — FENTANYL CITRATE (PF) 100 MCG/2ML IJ SOLN
50.0000 ug | INTRAMUSCULAR | Status: AC | PRN
  Administered 2016-02-27 (×2): 50 ug via INTRAVENOUS
  Administered 2016-02-27: 100 ug via INTRAVENOUS

## 2016-02-27 MED ORDER — OXYCODONE HCL 5 MG PO TABS
ORAL_TABLET | ORAL | Status: AC
Start: 2016-02-27 — End: 2016-02-27
  Filled 2016-02-27: qty 1

## 2016-02-27 MED ORDER — LIDOCAINE HCL (CARDIAC) 20 MG/ML IV SOLN
INTRAVENOUS | Status: DC | PRN
Start: 2016-02-27 — End: 2016-02-27
  Administered 2016-02-27: 60 mg via INTRAVENOUS

## 2016-02-27 MED ORDER — PROPOFOL 10 MG/ML IV BOLUS
INTRAVENOUS | Status: DC | PRN
  Administered 2016-02-27: 180 mg via INTRAVENOUS
  Administered 2016-02-27: 40 mg via INTRAVENOUS

## 2016-02-27 MED ORDER — CEFAZOLIN SODIUM-DEXTROSE 2-4 GM/100ML-% IV SOLN
INTRAVENOUS | Status: AC
  Filled 2016-02-27: qty 100

## 2016-02-27 MED ORDER — BUPIVACAINE-EPINEPHRINE 0.25% -1:200000 IJ SOLN
INTRAMUSCULAR | Status: DC | PRN
  Administered 2016-02-27: 14.5 mL

## 2016-02-27 MED ORDER — LIDOCAINE-EPINEPHRINE 1 %-1:100000 IJ SOLN
INTRAMUSCULAR | Status: AC
  Filled 2016-02-27: qty 2

## 2016-02-27 MED ORDER — BUPIVACAINE-EPINEPHRINE (PF) 0.25% -1:200000 IJ SOLN
INTRAMUSCULAR | Status: AC
  Filled 2016-02-27: qty 60

## 2016-02-27 MED ORDER — SODIUM CHLORIDE 0.9 % IR SOLN
Status: DC | PRN
  Administered 2016-02-27: 500 mL

## 2016-02-27 MED ORDER — OXYCODONE HCL 5 MG PO TABS
5.0000 mg | ORAL_TABLET | Freq: Once | ORAL | Status: AC | PRN
Start: 2016-02-27 — End: 2016-02-27
  Administered 2016-02-27: 5 mg via ORAL

## 2016-02-27 MED ORDER — LACTATED RINGERS IV SOLN
INTRAVENOUS | Status: DC
  Administered 2016-02-27 (×2): via INTRAVENOUS

## 2016-02-27 MED ORDER — SCOPOLAMINE 1 MG/3DAYS TD PT72
1.0000 | MEDICATED_PATCH | Freq: Once | TRANSDERMAL | Status: DC | PRN
Start: 2016-02-27 — End: 2016-02-27

## 2016-02-27 MED ORDER — ONDANSETRON HCL 4 MG/2ML IJ SOLN: 4.0000 mg | Freq: Four times a day (QID) | INTRAMUSCULAR | Status: DC | PRN

## 2016-02-27 MED ORDER — MIDAZOLAM HCL 2 MG/2ML IJ SOLN
1.0000 mg | INTRAMUSCULAR | Status: DC | PRN
  Administered 2016-02-27: 2 mg via INTRAVENOUS

## 2016-02-27 MED ORDER — FENTANYL CITRATE (PF) 100 MCG/2ML IJ SOLN
INTRAMUSCULAR | Status: AC
  Filled 2016-02-27: qty 2

## 2016-02-27 MED ORDER — BACITRACIN ZINC 500 UNIT/GM EX OINT
TOPICAL_OINTMENT | CUTANEOUS | Status: AC
  Filled 2016-02-27: qty 28.35

## 2016-02-27 MED ORDER — DEXAMETHASONE SODIUM PHOSPHATE 4 MG/ML IJ SOLN
INTRAMUSCULAR | Status: DC | PRN
Start: 2016-02-27 — End: 2016-02-27
  Administered 2016-02-27: 10 mg via INTRAVENOUS

## 2016-02-27 MED ORDER — LIDOCAINE HCL (PF) 1 % IJ SOLN
INTRAMUSCULAR | Status: AC
  Filled 2016-02-27: qty 60

## 2016-02-27 MED ORDER — HYDROMORPHONE HCL 1 MG/ML IJ SOLN: 0.2500 mg | INTRAMUSCULAR | Status: DC | PRN

## 2016-02-27 MED ORDER — EPINEPHRINE HCL 1 MG/ML IJ SOLN
INTRAMUSCULAR | Status: AC
  Filled 2016-02-27: qty 1

## 2016-02-27 SURGICAL SUPPLY — 88 items
ADH SKN CLS APL DERMABOND .7 (GAUZE/BANDAGES/DRESSINGS) ×6
BAG DECANTER FOR FLEXI CONT (MISCELLANEOUS) ×8 IMPLANT
BINDER ABDOMINAL  9 SM 30-45 (SOFTGOODS)
BINDER ABDOMINAL 10 UNV 27-48 (MISCELLANEOUS) IMPLANT
BINDER ABDOMINAL 12 ML 46-62 (SOFTGOODS) IMPLANT
BINDER ABDOMINAL 9 SM 30-45 (SOFTGOODS) IMPLANT
BINDER BREAST LRG (GAUZE/BANDAGES/DRESSINGS) IMPLANT
BINDER BREAST MEDIUM (GAUZE/BANDAGES/DRESSINGS) IMPLANT
BINDER BREAST XLRG (GAUZE/BANDAGES/DRESSINGS) IMPLANT
BINDER BREAST XXLRG (GAUZE/BANDAGES/DRESSINGS) IMPLANT
BIOPATCH RED 1 DISK 7.0 (GAUZE/BANDAGES/DRESSINGS) IMPLANT
BIOPATCH RED 1IN DISK 7.0MM (GAUZE/BANDAGES/DRESSINGS)
BLADE HEX COATED 2.75 (ELECTRODE) ×5 IMPLANT
BLADE SURG 10 STRL SS (BLADE) ×5 IMPLANT
BLADE SURG 15 STRL LF DISP TIS (BLADE) ×3 IMPLANT
BLADE SURG 15 STRL SS (BLADE) ×5
BNDG GAUZE ELAST 4 BULKY (GAUZE/BANDAGES/DRESSINGS) ×10 IMPLANT
CANISTER SUCT 1200ML W/VALVE (MISCELLANEOUS) ×5 IMPLANT
CHLORAPREP W/TINT 26ML (MISCELLANEOUS) ×5 IMPLANT
CLOSURE WOUND 1/2 X4 (GAUZE/BANDAGES/DRESSINGS) ×1
COVER BACK TABLE 60X90IN (DRAPES) ×5 IMPLANT
COVER MAYO STAND STRL (DRAPES) ×5 IMPLANT
DECANTER SPIKE VIAL GLASS SM (MISCELLANEOUS) IMPLANT
DERMABOND ADVANCED (GAUZE/BANDAGES/DRESSINGS) ×4
DERMABOND ADVANCED .7 DNX12 (GAUZE/BANDAGES/DRESSINGS) ×2 IMPLANT
DRAIN CHANNEL 19F RND (DRAIN) IMPLANT
DRAPE LAPAROSCOPIC ABDOMINAL (DRAPES) ×5 IMPLANT
DRSG PAD ABDOMINAL 8X10 ST (GAUZE/BANDAGES/DRESSINGS) ×10 IMPLANT
DRSG TEGADERM 2-3/8X2-3/4 SM (GAUZE/BANDAGES/DRESSINGS) IMPLANT
ELECT BLADE 4.0 EZ CLEAN MEGAD (MISCELLANEOUS) ×5
ELECT COATED BLADE 2.86 ST (ELECTRODE) ×5 IMPLANT
ELECT REM PT RETURN 9FT ADLT (ELECTROSURGICAL) ×5
ELECTRODE BLDE 4.0 EZ CLN MEGD (MISCELLANEOUS) ×3 IMPLANT
ELECTRODE REM PT RTRN 9FT ADLT (ELECTROSURGICAL) ×3 IMPLANT
EVACUATOR SILICONE 100CC (DRAIN) IMPLANT
FILTER LIPOSUCTION (MISCELLANEOUS) ×2 IMPLANT
GAUZE SPONGE 4X4 12PLY STRL (GAUZE/BANDAGES/DRESSINGS) IMPLANT
GLOVE BIO SURGEON STRL SZ 6.5 (GLOVE) ×8 IMPLANT
GLOVE BIO SURGEONS STRL SZ 6.5 (GLOVE) ×2
GOWN STRL REUS W/ TWL LRG LVL3 (GOWN DISPOSABLE) ×6 IMPLANT
GOWN STRL REUS W/TWL LRG LVL3 (GOWN DISPOSABLE) ×10
IMPLANT BREAST MP GEL 390CC (Breast) ×3 IMPLANT
IV NS 1000ML (IV SOLUTION)
IV NS 1000ML BAXH (IV SOLUTION) IMPLANT
LINER CANISTER 1000CC FLEX (MISCELLANEOUS) ×2 IMPLANT
NDL HYPO 25X1 1.5 SAFETY (NEEDLE) ×2 IMPLANT
NDL SAFETY ECLIPSE 18X1.5 (NEEDLE) ×3 IMPLANT
NEEDLE HYPO 18GX1.5 SHARP (NEEDLE) ×5
NEEDLE HYPO 25X1 1.5 SAFETY (NEEDLE) ×5 IMPLANT
NS IRRIG 1000ML POUR BTL (IV SOLUTION) ×5 IMPLANT
PACK BASIN DAY SURGERY FS (CUSTOM PROCEDURE TRAY) ×5 IMPLANT
PAD ALCOHOL SWAB (MISCELLANEOUS) ×2 IMPLANT
PENCIL BUTTON HOLSTER BLD 10FT (ELECTRODE) ×5 IMPLANT
PIN SAFETY STERILE (MISCELLANEOUS) IMPLANT
SIZER BREAST GENERIC (SIZER) ×3 IMPLANT
SIZER BREAST REUSE 375CC (SIZER) ×5
SIZER BREAST REUSE 390CC (SIZER) ×3 IMPLANT
SIZER BREAST SILICONE GENERIC (SIZER) ×3 IMPLANT
SIZER BRST REUSE P4.8 12 375CC (SIZER) ×1 IMPLANT
SLEEVE SCD COMPRESS KNEE MED (MISCELLANEOUS) ×5 IMPLANT
SPONGE GAUZE 4X4 12PLY STER LF (GAUZE/BANDAGES/DRESSINGS) IMPLANT
SPONGE LAP 18X18 X RAY DECT (DISPOSABLE) ×13 IMPLANT
STRIP CLOSURE SKIN 1/2X4 (GAUZE/BANDAGES/DRESSINGS) ×4 IMPLANT
STRIP SUTURE WOUND CLOSURE 1/2 (SUTURE) ×6 IMPLANT
SUT MNCRL AB 3-0 PS2 18 (SUTURE) IMPLANT
SUT MNCRL AB 4-0 PS2 18 (SUTURE) ×5 IMPLANT
SUT MON AB 3-0 SH 27 (SUTURE) ×5
SUT MON AB 3-0 SH27 (SUTURE) ×3 IMPLANT
SUT MON AB 5-0 PS2 18 (SUTURE) ×10 IMPLANT
SUT PDS 3-0 CT2 (SUTURE)
SUT PDS AB 2-0 CT2 27 (SUTURE) IMPLANT
SUT PDS II 3-0 CT2 27 ABS (SUTURE) IMPLANT
SUT SILK 3 0 PS 1 (SUTURE) IMPLANT
SUT VIC AB 3-0 SH 27 (SUTURE) ×5
SUT VIC AB 3-0 SH 27X BRD (SUTURE) ×3 IMPLANT
SUT VICRYL 4-0 PS2 18IN ABS (SUTURE) ×8 IMPLANT
SYR 50ML LL SCALE MARK (SYRINGE) ×2 IMPLANT
SYR BULB IRRIGATION 50ML (SYRINGE) ×5 IMPLANT
SYR CONTROL 10ML LL (SYRINGE) ×5 IMPLANT
SYR TB 1ML LL NO SAFETY (SYRINGE) ×2 IMPLANT
TAPE MEASURE VINYL STERILE (MISCELLANEOUS) ×2 IMPLANT
TOWEL OR 17X24 6PK STRL BLUE (TOWEL DISPOSABLE) ×10 IMPLANT
TUBE CONNECTING 20'X1/4 (TUBING) ×1
TUBE CONNECTING 20X1/4 (TUBING) ×4 IMPLANT
TUBING INFILTRATION IT-10001 (TUBING) IMPLANT
TUBING SET GRADUATE ASPIR 12FT (MISCELLANEOUS) ×2 IMPLANT
UNDERPAD 30X30 (UNDERPADS AND DIAPERS) ×10 IMPLANT
YANKAUER SUCT BULB TIP NO VENT (SUCTIONS) ×5 IMPLANT

## 2016-02-27 NOTE — Transfer of Care (Signed)
Immediate Anesthesia Transfer of Care Note  Patient: [name]  Procedure(s) Performed: Procedure(s): REMOVAL OF RIGHT  TISSUE EXPANDERS WITH PLACEMENT OF RIGHT  BREAST IMPLANTS  (Right) LEFT BREAST MASTOPEXY (Left)  Patient Location: PACU  Anesthesia Type:General  Level of Consciousness: sedated  Airway & Oxygen Therapy: Patient Spontanous Breathing and Patient connected to face mask oxygen  Post-op Assessment: Report given to RN and Post -op Vital signs reviewed and stable  Post vital signs: Reviewed and stable  Last Vitals:  Filed Vitals:   02/27/16 1114  BP: 128/59  Pulse: 73  Temp: 37 C  Resp: 18    Last Pain: There were no vitals filed for this visit.    Patients Stated Pain Goal: 0 ([id number])  Complications: No apparent anesthesia complications

## 2016-02-27 NOTE — Brief Op Note (Signed)
02/27/2016  12:50 PM  PATIENT:  [name]  50 y.o. female  PRE-OPERATIVE DIAGNOSIS:  RIGHT BREAST CANCER  POST-OPERATIVE DIAGNOSIS:  * No post-op diagnosis entered *  PROCEDURE:  Procedure(s): REMOVAL OF RIGHT  TISSUE EXPANDERS WITH PLACEMENT OF RIGHT  BREAST IMPLANTS  (Right) LEFT BREAST MASTOPEXY (Left) POSSIBLE LIPOSUCTION (Bilateral)  SURGEON:  Surgeon(s) and Role:    * Woodie Degraffenreid S Mohid Furuya, DO - Primary  PHYSICIAN ASSISTANT: Shawn Rayburn, PA  ASSISTANTS: none   ANESTHESIA:   general and local  EBL:     BLOOD ADMINISTERED:none  DRAINS: none   LOCAL MEDICATIONS USED:  MARCAINE    and LIDOCAINE   SPECIMEN:  Source of Specimen:  right mastectomy scar and left breast tissue  DISPOSITION OF SPECIMEN:  PATHOLOGY  COUNTS:  YES  TOURNIQUET:  * No tourniquets in log *  DICTATION: .Dragon Dictation  PLAN OF CARE: Discharge to home after PACU  PATIENT DISPOSITION:  PACU - hemodynamically stable.   Delay start of Pharmacological VTE agent (>24hrs) due to surgical blood loss or risk of bleeding: no

## 2016-02-27 NOTE — Anesthesia Procedure Notes (Signed)
Procedure Name: LMA Insertion Date/Time: 02/27/2016 12:55 PM Performed by: Maryella Shivers Pre-anesthesia Checklist: Patient identified, Emergency Drugs available, Suction available and Patient being monitored Patient Re-evaluated:Patient Re-evaluated prior to inductionOxygen Delivery Method: Circle system utilized Preoxygenation: Pre-oxygenation with 100% oxygen Intubation Type: IV induction Ventilation: Mask ventilation without difficulty LMA: LMA inserted LMA Size: 4.0 Number of attempts: 1 Airway Equipment and Method: Bite block Placement Confirmation: positive ETCO2 Tube secured with: Tape Dental Injury: Teeth and Oropharynx as per pre-operative assessment

## 2016-02-27 NOTE — Discharge Instructions (Signed)
May shower tomorrow. Continue binder or sports bra. No heavy lifting.    Post Anesthesia Home Care Instructions  Activity: Get plenty of rest for the remainder of the day. A responsible adult should stay with you for 24 hours following the procedure.  For the next 24 hours, DO NOT: -Drive a car -Paediatric nurse -Drink alcoholic beverages -Take any medication unless instructed by your physician -Make any legal decisions or sign important papers.  Meals: Start with liquid foods such as gelatin or soup. Progress to regular foods as tolerated. Avoid greasy, spicy, heavy foods. If nausea and/or vomiting occur, drink only clear liquids until the nausea and/or vomiting subsides. Call your physician if vomiting continues.  Special Instructions/Symptoms: Your throat may feel dry or sore from the anesthesia or the breathing tube placed in your throat during surgery. If this causes discomfort, gargle with warm salt water. The discomfort should disappear within 24 hours.  If you had a scopolamine patch placed behind your ear for the management of post- operative nausea and/or vomiting:  1. The medication in the patch is effective for 72 hours, after which it should be removed.  Wrap patch in a tissue and discard in the trash. Wash hands thoroughly with soap and water. 2. You may remove the patch earlier than 72 hours if you experience unpleasant side effects which may include dry mouth, dizziness or visual disturbances. 3. Avoid touching the patch. Wash your hands with soap and water after contact with the patch.

## 2016-02-27 NOTE — H&P (Signed)
[name] is an 50 y.o. female.   Chief Complaint: acquired absence of right breast HPI: The patient is a 50 yrs old wf here with her husband for follow up and further discussion for her right breast cancer reconstruction.  She is also interested in A left mastopexy for symmetry.  She has 410/300 cc in her expander.  She was called by the oncology/radiology department for mapping and starting radiation.  She wanted to talk more about her options for timing of the reconstruction.  She had the expander placed ~ 2 months ago.  She is also suffering with daily pain from her arthritis.  She is not able to start back on the meds until her reconstruction is finished.  History: She was diagnoses with invasive ductal carcinoma of the RIGHT breast, receptor positive, HER-2 negative, Ki-67 7%. She felt a right breast mass which started the process of diagnosis. The CT and MRI showed portal hypertension, splenomegaly and the known breast mass. 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 She has a vertical abdominal scar from the hysterectomy. She is married and has an adopted daughter. She is 5 feet 6 inches tall and weighs 220 pounds. Preop is likely a 40 B/C bra size.   Past Medical History  Diagnosis Date  . Psoriatic arthritis ([place])   . Breast cancer ([place])   . Anxiety   . HTN (hypertension)     no meds now    Past Surgical History  Procedure Laterality Date  . Total abdominal hysterectomy    . Tonsillectomy    . Simple mastectomy with axillary sentinel node biopsy Right 01/09/2016    Procedure: INJECTION BLUE DYE RIGHT BREAST,  TOTAL MASTECTOMY WITH  RIGHT AXILLARY SENTINEL NODE BIOPSY;  Surgeon: Fanny Skates, MD;  Location: [facility];  Service: General;  Laterality: Right;  . Breast reconstruction with placement of tissue expander and flex hd (acellular hydrated dermis) Right 01/09/2016    Procedure: BREAST RECONSTRUCTION WITH PLACEMENT OF TISSUE EXPANDER AND FLEX HD (ACELLULAR  HYDRATED DERMIS);  Surgeon: Loel Lofty Sallie Staron, DO;  Location: [facility];  Service: Plastics;  Laterality: Right;    Family History  Problem Relation Age of Onset  . Cancer - Colon Cousin    Social History:  reports that she has never smoked. She does not have any smokeless tobacco history on file. She reports that she does not drink alcohol or use illicit drugs.  Allergies:  Allergies  Allergen Reactions  . Xanax [Alprazolam] Hives and Shortness Of Breath    swelling  . Tape     Medications Prior to Admission  Medication Sig Dispense Refill  . LORazepam (ATIVAN) 1 MG tablet Take 1 tablet by mouth every 6 (six) hours as needed.    . Multiple Vitamins-Minerals (MULTIVITAMIN WITH MINERALS) tablet Take 1 tablet by mouth daily.      No results found for this or any previous visit (from the past 48 hour(s)). No results found.  Review of Systems  Constitutional: Negative.   HENT: Negative.   Eyes: Negative.   Respiratory: Negative.   Cardiovascular: Negative.   Gastrointestinal: Negative.   Genitourinary: Negative.   Musculoskeletal: Negative.   Skin: Negative.   Neurological: Negative.   Psychiatric/Behavioral: Negative.     Blood pressure 128/59, pulse 73, temperature 98.6 F (37 C), temperature source Oral, resp. rate 18, height '5\' 6"'  (1.676 m), weight 83.462 kg (184 lb), SpO2 100 %. Physical Exam  Constitutional: She is oriented to  person, place, and time. She appears well-developed and well-nourished.  HENT:  Head: Normocephalic and atraumatic.  Eyes: Conjunctivae and EOM are normal. Pupils are equal, round, and reactive to light.  Cardiovascular: Normal rate.   Respiratory: Effort normal.  GI: Soft.  Neurological: She is alert and oriented to person, place, and time.  Skin: Skin is warm.  Psychiatric: She has a normal mood and affect. Her behavior is normal. Judgment and thought content normal.     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 Plan -  exchange of the right expander to an implant with a left mastopexy at the same time.  Wallace Going, DO 02/27/2016, 12:23 PM

## 2016-02-27 NOTE — Anesthesia Preprocedure Evaluation (Signed)
Anesthesia Evaluation  Patient identified by MRN, date of birth, ID band Patient awake    Reviewed: Allergy & Precautions, NPO status , Patient's Chart, lab work & pertinent test results  Airway Mallampati: II   Neck ROM: full    Dental   Pulmonary neg pulmonary ROS,    breath sounds clear to auscultation       Cardiovascular hypertension,  Rhythm:regular Rate:Normal     Neuro/Psych Anxiety    GI/Hepatic   Endo/Other    Renal/GU      Musculoskeletal   Abdominal   Peds  Hematology   Anesthesia Other Findings   Reproductive/Obstetrics                             Anesthesia Physical Anesthesia Plan  ASA: II  Anesthesia Plan: General   Post-op Pain Management:    Induction: Intravenous  Airway Management Planned: LMA  Additional Equipment:   Intra-op Plan:   Post-operative Plan:   Informed Consent: I have reviewed the patients History and Physical, chart, labs and discussed the procedure including the risks, benefits and alternatives for the proposed anesthesia with the patient or authorized representative who has indicated his/her understanding and acceptance.     Plan Discussed with: CRNA, Anesthesiologist and Surgeon  Anesthesia Plan Comments:         Anesthesia Quick Evaluation

## 2016-02-27 NOTE — Anesthesia Postprocedure Evaluation (Signed)
Anesthesia Post Note  Patient: [name]  Procedure(s) Performed: Procedure(s) (LRB): REMOVAL OF RIGHT  TISSUE EXPANDERS WITH PLACEMENT OF RIGHT  BREAST IMPLANTS  (Right) LEFT BREAST MASTOPEXY (Left)  Patient location during evaluation: PACU Anesthesia Type: General Level of consciousness: awake and alert and patient cooperative Pain management: pain level controlled Vital Signs Assessment: post-procedure vital signs reviewed and stable Respiratory status: spontaneous breathing and respiratory function stable Cardiovascular status: stable Anesthetic complications: no    Last Vitals:  Filed Vitals:   02/27/16 1500 02/27/16 1514  BP: 126/64   Pulse: 71 66  Temp:    Resp: 17 17    Last Pain:  Filed Vitals:   02/27/16 1520  PainSc: [place]

## 2016-02-27 NOTE — Op Note (Addendum)
Op report Unilateral Breast Exchange and Mastopexy  DATE OF OPERATION:  02/27/2016  LOCATION: [facility]  SURGICAL DIVISION: Plastic Surgery  PREOPERATIVE DIAGNOSES:  1. History of right breast cancer.  2. Acquired absence of right breast.  3. Left breast asymmetry after reconstruction.  POSTOPERATIVE DIAGNOSES:  1. History of right breast cancer.  2. Acquired absence of right breast.  3. Left breast asymmetry after reconstruction  PROCEDURE:  1. Exchange of right tissue expander for implant. 2. Capsulotomies for implant respositioning. 3. Mastopexy of left breast for symmetry.  SURGEON: Travon Crochet Sanger Esker Dever, DO  ASSISTANT: Shawn Rayburn, PA  ANESTHESIA:  General.   COMPLICATIONS: None.   IMPLANTS: [place] Smooth Round High Profile Gel 390cc. Ref [id number].  Serial Number [id number]  INDICATIONS FOR PROCEDURE:  The patient, [name], is a 50 y.o. female born on 04/27/1966, is here for treatment for further treatment after a mastectomy and placement of a tissue expander. She now presents for exchange of her expander for an implant.  She requires capsulotomies to better position the implant.  She has asymmetry of the left breast due to the reconstruction as well and we will plan for a mastopexy for symmetry.  MRN: [id number]  CONSENT:  Informed consent was obtained directly from the patient. Risks, benefits and alternatives were fully discussed. Specific risks including but not limited to bleeding, infection, hematoma, seroma, scarring, pain, implant infection, implant extrusion, capsular contracture, asymmetry, wound healing problems, and need for further surgery were all discussed. The patient did have an ample opportunity to have her questions answered to her satisfaction.   DESCRIPTION OF PROCEDURE:  The patient was taken to the operating room. SCDs were placed and IV antibiotics were given. The patient's chest was prepped and draped in a  sterile fashion. A time out was performed and the implants to be used were identified.  One percent Xylocaine with epinephrine was used to infiltrate the area.   A mastopexy was done on the left side for symmetry.  The nipple areola was marked with the 42 mm cookie cutter. An ice cream cone shape lift was performed to minimize removal of tissue.  The nipple areola was repositioned superiorly.  The deep layers were closed with 4-0 Monocryl followed by 5-0 Monocryl to close the skin edges.  The vertical limb was closed in the same fashion.  The old mastectomy scar was opened and superior mastectomy and inferior mastectomy flaps were re-raised over the pectoralis major muscle. The pectoralis was split to expose the tissue expander which was removed. Inspection of the pocket showed a normal healthy capsule and good integration of the biologic matrix.   Circumferential capsulotomies were performed to allow for breast pocket expansion.  The lower capsule was excised that was superficial to the flex.  Measurements were made to confirm adequate pocket size for the implant dimensions.  Hemostasis was ensured with electrocautery.  The pocket was irrigated with antibiotic solution.  New gloves were placed.  The implant was placed in the pocket and oriented appropriately. The pectoralis major muscle and capsule on the anterior surface were re-closed with a 3-0 running Monocryl suture. The remaining skin was closed with 4-0 Monocryl deep dermal and 5-0 Monocryl subcuticular stitches.  Dermabond and steri strips were applied to the left and dermabond to the right..  A breast binder and ABD was applied.  The patient was allowed to wake from anesthesia and taken to the recovery room in satisfactory condition.

## 2016-03-01 ENCOUNTER — Encounter (HOSPITAL_BASED_OUTPATIENT_CLINIC_OR_DEPARTMENT_OTHER): Payer: Self-pay | Admitting: Plastic Surgery

## 2016-03-17 ENCOUNTER — Telehealth: Payer: Self-pay | Admitting: Oncology

## 2016-03-17 ENCOUNTER — Ambulatory Visit: Payer: Managed Care, Other (non HMO) | Admitting: Radiation Oncology

## 2016-03-17 ENCOUNTER — Telehealth: Payer: Self-pay | Admitting: *Deleted

## 2016-03-17 NOTE — Telephone Encounter (Signed)
Received call from patient stating she's not sure she should only be seeing the plastic surgeon.  She states she has a seroma that they are draining and possible infection.  She was placed on antibiotics 7/28.  Per Dr. Sondra Come postpone CT Sim that was scheduled for today.  She will also see Dr. Marla Roe today and I informed her that would reschedule her sim according to Dr. Eusebio Friendly reccomendations.  Pt. Verbalized understanding.  Also informed her that Dr. Lindi Adie will see her after she has finished radiation and that we need to move her appointment in September with him accordingly.

## 2016-03-17 NOTE — Telephone Encounter (Addendum)
Received a message from Iris Pert, RN that [place] has a seroma and is wondering about [facility] today.  Per Dr. Sondra Come, postpone CT [facility] for today and check with patient after seeing Dr. Marla Roe about when it can be rescheduled.  Called [place] and let her know that CT SIM will be canceled today.  Varney Biles said she would call the patient.

## 2016-03-18 ENCOUNTER — Telehealth: Payer: Self-pay | Admitting: Oncology

## 2016-03-18 NOTE — Telephone Encounter (Signed)
Called [name] to see if she is feeling better today.  She said that she is feeling better and that her CT SIM will be next week.  She is still disappointed that it had to be postponed but is relieved is only for one week.

## 2016-03-24 ENCOUNTER — Ambulatory Visit
Admission: RE | Admit: 2016-03-24 | Discharge: 2016-03-24 | Disposition: A | Discharge status: Home / Self Care | Payer: Managed Care, Other (non HMO) | Source: Ambulatory Visit | Attending: Radiation Oncology | Admitting: Radiation Oncology

## 2016-03-24 DIAGNOSIS — Z510 Encounter for antineoplastic radiation therapy: Secondary | ICD-10-CM | POA: Diagnosis not present

## 2016-03-24 DIAGNOSIS — C50411 Malignant neoplasm of upper-outer quadrant of right female breast: Secondary | ICD-10-CM

## 2016-03-24 NOTE — Progress Notes (Signed)
  Radiation Oncology         (336) [phone] ________________________________  Name: [name] MRN: [id number]  Date: 03/24/2016  DOB: 1966-07-24  SIMULATION AND TREATMENT PLANNING NOTE    ICD-9-CM ICD-10-CM   1. 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  Identity was confirmed.  All relevant records and images related to the planned course of therapy were reviewed.  The patient freely provided informed written consent to proceed with treatment after reviewing the details related to the planned course of therapy. The consent form was witnessed and verified by the simulation staff.  Then, the patient was set-up in a stable reproducible  supine position for radiation therapy.  CT images were obtained.  Surface markings were placed.  The CT images were loaded into the planning software.  Then the target and avoidance structures were contoured.  Treatment planning then occurred.  The radiation prescription was entered and confirmed.  Then, I designed and supervised the construction of a total of 5 medically necessary complex treatment devices.  I have requested : 3D Simulation  I have requested a DVH of the following structures: Heart lungs PTV.  I have ordered:dose calc.  PLAN:  The patient will receive 45 Gy in 25 fractions, followed by a boost to the mastectomy scar of 10 gray for a cumulative dose of 55 gray.  -----------------------------------  Blair Promise, PhD, MD  This document serves as a record of services personally performed by Gery Pray, MD. It was created on his behalf by Darcus Austin, a trained medical scribe. The creation of this record is based on the scribe's personal observations and the provider's statements to them. This document has been checked and approved by  the attending provider.

## 2016-03-26 ENCOUNTER — Encounter: Payer: Self-pay | Admitting: Hematology and Oncology

## 2016-03-31 ENCOUNTER — Ambulatory Visit
Admission: RE | Admit: 2016-03-31 | Discharge: 2016-03-31 | Disposition: A | Discharge status: Home / Self Care | Payer: Managed Care, Other (non HMO) | Source: Ambulatory Visit | Attending: Radiation Oncology | Admitting: Radiation Oncology

## 2016-03-31 DIAGNOSIS — C50411 Malignant neoplasm of upper-outer quadrant of right female breast: Secondary | ICD-10-CM

## 2016-03-31 DIAGNOSIS — Z510 Encounter for antineoplastic radiation therapy: Secondary | ICD-10-CM | POA: Diagnosis not present

## 2016-03-31 NOTE — Progress Notes (Signed)
  Radiation Oncology         [phone] ________________________________  Name: [name] MRN: [id number]  Date: 03/31/2016  DOB: [birth year]  Simulation Verification Note    ICD-9-CM ICD-10-CM   1. Breast cancer of upper-outer quadrant of right female breast ([place]) 174.4 C50.411     Status: outpatient  NARRATIVE: The patient was brought to the treatment unit and placed in the planned treatment position. The clinical setup was verified. Then port films were obtained and uploaded to the radiation oncology medical record software.  The treatment beams were carefully compared against the planned radiation fields. The position location and shape of the radiation fields was reviewed. They targeted volume of tissue appears to be appropriately covered by the radiation beams. Organs at risk appear to be excluded as planned.  Based on my personal review, I approved the simulation verification. The patient's treatment will proceed as planned.  -----------------------------------  Blair Promise, PhD, MD

## 2016-04-01 ENCOUNTER — Ambulatory Visit
Admission: RE | Admit: 2016-04-01 | Discharge: 2016-04-01 | Disposition: A | Discharge status: Home / Self Care | Payer: Managed Care, Other (non HMO) | Source: Ambulatory Visit | Attending: Radiation Oncology | Admitting: Radiation Oncology

## 2016-04-01 DIAGNOSIS — Z510 Encounter for antineoplastic radiation therapy: Secondary | ICD-10-CM | POA: Diagnosis not present

## 2016-04-02 ENCOUNTER — Ambulatory Visit
Admission: RE | Admit: 2016-04-02 | Discharge: 2016-04-02 | Disposition: A | Discharge status: Home / Self Care | Payer: Managed Care, Other (non HMO) | Source: Ambulatory Visit | Attending: Radiation Oncology | Admitting: Radiation Oncology

## 2016-04-02 DIAGNOSIS — Z510 Encounter for antineoplastic radiation therapy: Secondary | ICD-10-CM | POA: Diagnosis not present

## 2016-04-03 ENCOUNTER — Ambulatory Visit
Admission: RE | Admit: 2016-04-03 | Discharge: 2016-04-03 | Disposition: A | Discharge status: Home / Self Care | Payer: Managed Care, Other (non HMO) | Source: Ambulatory Visit | Attending: Radiation Oncology | Admitting: Radiation Oncology

## 2016-04-03 DIAGNOSIS — Z510 Encounter for antineoplastic radiation therapy: Secondary | ICD-10-CM | POA: Diagnosis not present

## 2016-04-06 ENCOUNTER — Ambulatory Visit
Admission: RE | Admit: 2016-04-06 | Discharge: 2016-04-06 | Disposition: A | Discharge status: Home / Self Care | Payer: Managed Care, Other (non HMO) | Source: Ambulatory Visit | Attending: Radiation Oncology | Admitting: Radiation Oncology

## 2016-04-06 DIAGNOSIS — Z510 Encounter for antineoplastic radiation therapy: Secondary | ICD-10-CM | POA: Diagnosis not present

## 2016-04-07 ENCOUNTER — Encounter: Payer: Self-pay | Admitting: Radiation Oncology

## 2016-04-07 ENCOUNTER — Ambulatory Visit
Admission: RE | Admit: 2016-04-07 | Discharge: 2016-04-07 | Disposition: A | Discharge status: Home / Self Care | Payer: Managed Care, Other (non HMO) | Source: Ambulatory Visit | Attending: Radiation Oncology | Admitting: Radiation Oncology

## 2016-04-07 VITALS — BP 126/60 | HR 64 | Temp 98.6°F | Ht 66.0 in | Wt 178.0 lb

## 2016-04-07 DIAGNOSIS — Z510 Encounter for antineoplastic radiation therapy: Secondary | ICD-10-CM | POA: Diagnosis not present

## 2016-04-07 DIAGNOSIS — C50411 Malignant neoplasm of upper-outer quadrant of right female breast: Secondary | ICD-10-CM

## 2016-04-07 MED ORDER — ALRA NON-METALLIC DEODORANT (RAD-ONC)
1.0000 "application " | Freq: Once | TOPICAL | Status: AC
  Administered 2016-04-07: 1 via TOPICAL

## 2016-04-07 MED ORDER — RADIAPLEXRX EX GEL
Freq: Once | CUTANEOUS | Status: AC
  Administered 2016-04-07: 11:00:00 via TOPICAL

## 2016-04-07 NOTE — Progress Notes (Signed)
Pt here for patient teaching.  Pt given Radiation and You booklet, skin care instructions, Alra deodorant and Radiaplex gel.  Reviewed areas of pertinence such as fatigue and skin changes . Pt able to give teach back of to pat skin and use unscented/gentle soap,apply Radiaplex bid, avoid applying anything to skin within 4 hours of treatment and to use an electric razor if they must shave. Pt demonstrated understanding and verbalizes understanding of information given and will contact nursing with any questions or concerns.          

## 2016-04-07 NOTE — Progress Notes (Signed)
  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 Breast cancer of upper-outer quadrant of right female breast ([place]) 174.4 C50.411      Current Dose: 9 Gy     Planned Dose:  45 Gy  Narrative . . . . . . . . The patient presents for routine under treatment assessment. has completed 5 fractions to her right chest wall.  She denies having pain or fatigue.  The skin on her right chest is pink.                                    The patient is without complaint.                                 Set-up films were reviewed.                                 The chart was checked. Physical Findings. . .  height is 5\' 6"  (1.676 m) and weight is 178 lb (80.7 kg). Her oral temperature is 98.6 F (37 C). Her blood pressure is 126/60 and her pulse is 64. Her oxygen saturation is 100%. . Weight essentially stable.  No significant changes. Lungs are clear to auscultation bilaterally. Heart has regular rate and rhythm. No palpable cervical, supraclavicular, or axillary adenopathy. . Slight hyperpigmentation changes along the right chest area.   Impression . . . . . . . The patient is tolerating radiation. Plan . . . . . . . . . . . . Continue treatment as planned.  ________________________________   Blair Promise, PhD, MD  This document serves as a record of services personally performed by Gery Pray, MD. It was created on his behalf by Truddie Hidden, a trained medical scribe. The creation of this record is based on the scribe's personal observations and the provider's statements to them. This document has been checked and approved by the attending provider.

## 2016-04-07 NOTE — Progress Notes (Signed)
Meggin has completed 5 fractions to her right chest wall.  She denies having pain or fatigue.  The skin on her right chest is pink.  BP 126/60 (BP Location: Left Arm, Patient Position: Sitting)   Pulse 64   Temp 98.6 F (37 C) (Oral)   Ht 5\' 6"  (1.676 m)   Wt 178 lb (80.7 kg)   SpO2 100%   BMI 28.73 kg/m    Wt Readings from Last 3 Encounters:  04/07/16 178 lb (80.7 kg)  02/27/16 184 lb (83.5 kg)  02/05/16 190 lb 1.6 oz (86.2 kg)

## 2016-04-08 ENCOUNTER — Ambulatory Visit
Admission: RE | Admit: 2016-04-08 | Discharge: 2016-04-08 | Disposition: A | Discharge status: Home / Self Care | Payer: Managed Care, Other (non HMO) | Source: Ambulatory Visit | Attending: Radiation Oncology | Admitting: Radiation Oncology

## 2016-04-08 DIAGNOSIS — Z510 Encounter for antineoplastic radiation therapy: Secondary | ICD-10-CM | POA: Diagnosis not present

## 2016-04-09 ENCOUNTER — Ambulatory Visit
Admission: RE | Admit: 2016-04-09 | Discharge: 2016-04-09 | Disposition: A | Discharge status: Home / Self Care | Payer: Managed Care, Other (non HMO) | Source: Ambulatory Visit | Attending: Radiation Oncology | Admitting: Radiation Oncology

## 2016-04-09 DIAGNOSIS — Z510 Encounter for antineoplastic radiation therapy: Secondary | ICD-10-CM | POA: Diagnosis not present

## 2016-04-10 ENCOUNTER — Ambulatory Visit
Admission: RE | Admit: 2016-04-10 | Discharge: 2016-04-10 | Disposition: A | Discharge status: Home / Self Care | Payer: Managed Care, Other (non HMO) | Source: Ambulatory Visit | Attending: Radiation Oncology | Admitting: Radiation Oncology

## 2016-04-10 ENCOUNTER — Telehealth: Payer: Self-pay | Admitting: *Deleted

## 2016-04-10 DIAGNOSIS — Z510 Encounter for antineoplastic radiation therapy: Secondary | ICD-10-CM | POA: Diagnosis not present

## 2016-04-10 NOTE — Telephone Encounter (Signed)
  Oncology Nurse Navigator Documentation  Navigator Location: [facility] (04/10/16 1300) Navigator Encounter Type: Telephone (04/10/16 1300) Telephone: [place] Call (04/10/16 1300)         Patient Visit Type: [id number] (04/10/16 1300) Treatment Phase: First Radiation Tx (04/10/16 1300)                            Time Spent with Patient: 15 (04/10/16 1300)

## 2016-04-13 ENCOUNTER — Ambulatory Visit
Admission: RE | Admit: 2016-04-13 | Discharge: 2016-04-13 | Disposition: A | Discharge status: Home / Self Care | Payer: Managed Care, Other (non HMO) | Source: Ambulatory Visit | Attending: Radiation Oncology | Admitting: Radiation Oncology

## 2016-04-13 DIAGNOSIS — Z510 Encounter for antineoplastic radiation therapy: Secondary | ICD-10-CM | POA: Diagnosis not present

## 2016-04-14 ENCOUNTER — Ambulatory Visit
Admission: RE | Admit: 2016-04-14 | Discharge: 2016-04-14 | Disposition: A | Discharge status: Home / Self Care | Payer: Managed Care, Other (non HMO) | Source: Ambulatory Visit | Attending: Radiation Oncology | Admitting: Radiation Oncology

## 2016-04-14 ENCOUNTER — Encounter: Payer: Self-pay | Admitting: Radiation Oncology

## 2016-04-14 VITALS — BP 120/68 | HR 66 | Temp 97.8°F | Ht 66.0 in | Wt 175.6 lb

## 2016-04-14 DIAGNOSIS — Z510 Encounter for antineoplastic radiation therapy: Secondary | ICD-10-CM | POA: Diagnosis not present

## 2016-04-14 DIAGNOSIS — C50411 Malignant neoplasm of upper-outer quadrant of right female breast: Secondary | ICD-10-CM

## 2016-04-14 NOTE — Progress Notes (Signed)
  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 Breast cancer of upper-outer quadrant of right female breast ([place]) 174.4 C50.411      Current Dose: 18 Gy     Planned Dose:  45 Gy  Narrative . . . . . . . . The patient presents for routine under treatment assessment. The patient completed 10 fractions to her right chest wall.  She denies having pain.  She denies having an increase in fatigue.  She has not started using radiaplex yet.  She reports her right breast feels "harder."  She will see her plastic surgeon next Thursday.  The skin on her right chest is pink. The patient describes the skin over her reconst. breast as feeling 'tighter.'                                      The patient is without complaint.                                 Set-up films were reviewed.                                 The chart was checked. Physical Findings. . .  height is 5\' 6"  (1.676 m) and weight is 175 lb 9.6 oz (79.7 kg). Her oral temperature is 97.8 F (36.6 C). Her blood pressure is 120/68 and her pulse is 66. Her oxygen saturation is 100%. . Weight essentially stable.  No significant changes. Lungs are clear to auscultation bilaterally. Heart has regular rate and rhythm. No palpable cervical, supraclavicular, or axillary adenopathy.  Slight hyperpigmentation changes along the right chest area and mild erythema.  Impression . . . . . . . The patient is tolerating radiation. Plan . . . . . . . . . . . . Continue treatment as planned.  ________________________________   Blair Promise, PhD, MD  This document serves as a record of services personally performed by Gery Pray, MD. It was created on his behalf by Truddie Hidden, a trained medical  scribe. The creation of this record is based on the scribe's personal observations and the provider's statements to them. This document has been checked and approved by the attending provider.

## 2016-04-14 NOTE — Progress Notes (Signed)
[name] has completed 10 fractions to her right chest wall.  She denies having pain.  She denies having an increase in fatigue.  She has not started using radiaplex yet.  She reports her right breast feels "harder."  She will see her plastic surgeon next Thursday.  The skin on her right chest is pink.  BP 120/68 (BP Location: Left Arm, Patient Position: Sitting)   Pulse 66   Temp 97.8 F (36.6 C) (Oral)   Ht 5\' 6"  (1.676 m)   Wt 175 lb 9.6 oz (79.7 kg)   SpO2 100%   BMI 28.34 kg/m    Wt Readings from Last 3 Encounters:  04/14/16 175 lb 9.6 oz (79.7 kg)  04/07/16 178 lb (80.7 kg)  02/27/16 184 lb (83.5 kg)

## 2016-04-15 ENCOUNTER — Ambulatory Visit
Admission: RE | Admit: 2016-04-15 | Discharge: 2016-04-15 | Disposition: A | Discharge status: Home / Self Care | Payer: Managed Care, Other (non HMO) | Source: Ambulatory Visit | Attending: Radiation Oncology | Admitting: Radiation Oncology

## 2016-04-15 DIAGNOSIS — Z510 Encounter for antineoplastic radiation therapy: Secondary | ICD-10-CM | POA: Diagnosis not present

## 2016-04-16 ENCOUNTER — Ambulatory Visit
Admission: RE | Admit: 2016-04-16 | Discharge: 2016-04-16 | Disposition: A | Discharge status: Home / Self Care | Payer: Managed Care, Other (non HMO) | Source: Ambulatory Visit | Attending: Radiation Oncology | Admitting: Radiation Oncology

## 2016-04-16 DIAGNOSIS — Z510 Encounter for antineoplastic radiation therapy: Secondary | ICD-10-CM | POA: Diagnosis not present

## 2016-04-17 ENCOUNTER — Ambulatory Visit
Admission: RE | Admit: 2016-04-17 | Discharge: 2016-04-17 | Disposition: A | Discharge status: Home / Self Care | Payer: Managed Care, Other (non HMO) | Source: Ambulatory Visit | Attending: Radiation Oncology | Admitting: Radiation Oncology

## 2016-04-17 DIAGNOSIS — Z510 Encounter for antineoplastic radiation therapy: Secondary | ICD-10-CM | POA: Diagnosis not present

## 2016-04-21 ENCOUNTER — Encounter: Payer: Self-pay | Admitting: Radiation Oncology

## 2016-04-21 ENCOUNTER — Ambulatory Visit
Admission: RE | Admit: 2016-04-21 | Discharge: 2016-04-21 | Disposition: A | Discharge status: Home / Self Care | Payer: Managed Care, Other (non HMO) | Source: Ambulatory Visit | Attending: Radiation Oncology | Admitting: Radiation Oncology

## 2016-04-21 VITALS — BP 123/63 | HR 71 | Temp 98.1°F | Ht 66.0 in | Wt 174.8 lb

## 2016-04-21 DIAGNOSIS — C50411 Malignant neoplasm of upper-outer quadrant of right female breast: Secondary | ICD-10-CM | POA: Insufficient documentation

## 2016-04-21 DIAGNOSIS — Z510 Encounter for antineoplastic radiation therapy: Secondary | ICD-10-CM | POA: Diagnosis not present

## 2016-04-21 MED ORDER — SONAFINE EX EMUL
1.0000 "application " | Freq: Once | CUTANEOUS | Status: AC
  Administered 2016-04-21: 1 via TOPICAL

## 2016-04-21 NOTE — Progress Notes (Signed)
  Radiation Oncology         [phone] ________________________________  Name: [name] MRN: [id number]  Date: 04/21/2016  DOB: 1966-08-15  Weekly Radiation Therapy Management    ICD-9-CM ICD-10-CM   1. 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 . . . . . . . The patient presents for routine under treatment assessment.   [name] has completed 14 fractions to her right chest wall.  She denies having pain or fatigue.  She is using radiaplex gel.  The skin on her right chest is red with dermatitis.  She reports it is itching and feels very firm and tight.  She has been given Sonafine to try by the nurse.                                 Set-up films were reviewed.                                 The chart was checked. Physical Findings. . .  height is 5\' 6"  (1.676 m) and weight is 174 lb 12.8 oz (79.3 kg). Her oral temperature is 98.1 F (36.7 C). Her blood pressure is 123/63 and her pulse is 71. Her oxygen saturation is 100%.   Lungs are clear to auscultation bilaterally. Heart has regular rate and rhythm. Radiation dermatitis in the upper inner aspect of the right chest also along the inframammary fold area. No moist desquamation. Impression . . . . . . . The patient is tolerating radiation. Plan . . . . . . . . . . . . Continue treatment as planned. She was given Sonafine by the nurse. ________________________________   Blair Promise, PhD, MD  This document serves as a record of services personally performed by Gery Pray, MD. It was created on his behalf by Darcus Austin, a trained medical scribe. The creation of this record is based on the scribe's personal observations and the provider's statements to them. This document has been checked and approved by  the attending provider.

## 2016-04-21 NOTE — Progress Notes (Signed)
[name] has completed 14 fractions to her right chest wall.  She denies having pain or fatigue.  She is using radiaplex gel.  The skin on her right chest is red with dermatitis.  She reports it is itching and feels very firm and tight.  She has been given Sonafine to try.  BP 123/63 (BP Location: Left Arm, Patient Position: Sitting)   Pulse 71   Temp 98.1 F (36.7 C) (Oral)   Ht 5\' 6"  (1.676 m)   Wt 174 lb 12.8 oz (79.3 kg)   SpO2 100%   BMI 28.21 kg/m    Wt Readings from Last 3 Encounters:  04/21/16 174 lb 12.8 oz (79.3 kg)  04/14/16 175 lb 9.6 oz (79.7 kg)  04/07/16 178 lb (80.7 kg)

## 2016-04-22 ENCOUNTER — Ambulatory Visit
Admission: RE | Admit: 2016-04-22 | Discharge: 2016-04-22 | Disposition: A | Discharge status: Home / Self Care | Payer: Managed Care, Other (non HMO) | Source: Ambulatory Visit | Attending: Radiation Oncology | Admitting: Radiation Oncology

## 2016-04-22 DIAGNOSIS — Z510 Encounter for antineoplastic radiation therapy: Secondary | ICD-10-CM | POA: Diagnosis not present

## 2016-04-23 ENCOUNTER — Ambulatory Visit: Payer: Managed Care, Other (non HMO) | Admitting: Hematology and Oncology

## 2016-04-23 ENCOUNTER — Ambulatory Visit
Admission: RE | Admit: 2016-04-23 | Discharge: 2016-04-23 | Disposition: A | Discharge status: Home / Self Care | Payer: Managed Care, Other (non HMO) | Source: Ambulatory Visit | Attending: Radiation Oncology | Admitting: Radiation Oncology

## 2016-04-23 DIAGNOSIS — Z510 Encounter for antineoplastic radiation therapy: Secondary | ICD-10-CM | POA: Diagnosis not present

## 2016-04-24 ENCOUNTER — Ambulatory Visit
Admission: RE | Admit: 2016-04-24 | Discharge: 2016-04-24 | Disposition: A | Discharge status: Home / Self Care | Payer: Managed Care, Other (non HMO) | Source: Ambulatory Visit | Attending: Radiation Oncology | Admitting: Radiation Oncology

## 2016-04-24 DIAGNOSIS — Z510 Encounter for antineoplastic radiation therapy: Secondary | ICD-10-CM | POA: Diagnosis not present

## 2016-04-27 ENCOUNTER — Ambulatory Visit
Admission: RE | Admit: 2016-04-27 | Discharge: 2016-04-27 | Disposition: A | Discharge status: Home / Self Care | Payer: Managed Care, Other (non HMO) | Source: Ambulatory Visit | Attending: Radiation Oncology | Admitting: Radiation Oncology

## 2016-04-27 DIAGNOSIS — Z510 Encounter for antineoplastic radiation therapy: Secondary | ICD-10-CM | POA: Diagnosis not present

## 2016-04-28 ENCOUNTER — Encounter: Payer: Self-pay | Admitting: Radiation Oncology

## 2016-04-28 ENCOUNTER — Ambulatory Visit
Admission: RE | Admit: 2016-04-28 | Discharge: 2016-04-28 | Disposition: A | Discharge status: Home / Self Care | Payer: Managed Care, Other (non HMO) | Source: Ambulatory Visit | Attending: Radiation Oncology | Admitting: Radiation Oncology

## 2016-04-28 VITALS — BP 123/70 | HR 73 | Temp 98.1°F | Ht 66.0 in | Wt 174.5 lb

## 2016-04-28 DIAGNOSIS — C50411 Malignant neoplasm of upper-outer quadrant of right female breast: Secondary | ICD-10-CM

## 2016-04-28 DIAGNOSIS — Z510 Encounter for antineoplastic radiation therapy: Secondary | ICD-10-CM | POA: Diagnosis not present

## 2016-04-28 NOTE — Progress Notes (Signed)
[name] has completed 19 fractions to her right chest wall.  She denies having pain but does have burning and tiching on her right chest.  She denies having fatigue but does take naps in the afternoons.  She is using sonafine and hydrocortisone for itching.  The skin on her right chest, right subclavian and shoulder is red.  BP 123/70 (BP Location: Left Arm, Patient Position: Sitting)   Pulse 73   Temp 98.1 F (36.7 C) (Oral)   Ht 5\' 6"  (1.676 m)   Wt 174 lb 8 oz (79.2 kg)   SpO2 100%   BMI 28.17 kg/m    Wt Readings from Last 3 Encounters:  04/28/16 174 lb 8 oz (79.2 kg)  04/21/16 174 lb 12.8 oz (79.3 kg)  04/14/16 175 lb 9.6 oz (79.7 kg)

## 2016-04-28 NOTE — Progress Notes (Signed)
  Radiation Oncology         [phone] ________________________________  Name: [name] MRN: [id number]  Date: 04/28/2016  DOB: 1965-12-15  Weekly Radiation Therapy Management    ICD-9-CM ICD-10-CM   1. 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 . . . . . . . The patient presents for routine under treatment assessment. [name] has completed 19 fractions to her right chest wall.  She denies having pain but does have burning and tiching on her right chest.  She denies having fatigue but does take naps in the afternoons.  She is using sonafine and hydrocortisone for itching.  The skin on her right chest, right subclavian and shoulder is red.    .                                 Set-up films were reviewed.                                 The chart was checked. Physical Findings. . .  height is 5\' 6"  (1.676 m) and weight is 174 lb 8 oz (79.2 kg). Her oral temperature is 98.1 F (36.7 C). Her blood pressure is 123/70 and her pulse is 73. Her oxygen saturation is 100%.   Lungs are clear to auscultation bilaterally. Heart has regular rate and rhythm. Radiation dermatitis in the upper inner aspect of the right chest also along the inframammary fold area. No moist desquamation.  Significant erythema throughout the breast.  Impression . . . . . . . The patient is tolerating radiation. Plan . . . . . . . . . . . . Continue treatment as planned. She was given Sonafine by the nurse. ________________________________   Blair Promise, PhD, MD  This document serves as a record of services personally performed by Gery Pray, MD. It was created on his behalf by Truddie Hidden, a trained medical scribe. The creation of this record is based on the scribe's personal observations and the provider's  statements to them. This document has been checked and approved by the attending provider.

## 2016-04-29 ENCOUNTER — Ambulatory Visit
Admission: RE | Admit: 2016-04-29 | Discharge: 2016-04-29 | Disposition: A | Discharge status: Home / Self Care | Payer: Managed Care, Other (non HMO) | Source: Ambulatory Visit | Attending: Radiation Oncology | Admitting: Radiation Oncology

## 2016-04-29 DIAGNOSIS — Z510 Encounter for antineoplastic radiation therapy: Secondary | ICD-10-CM | POA: Diagnosis not present

## 2016-04-30 ENCOUNTER — Ambulatory Visit
Admission: RE | Admit: 2016-04-30 | Discharge: 2016-04-30 | Disposition: A | Discharge status: Home / Self Care | Payer: Managed Care, Other (non HMO) | Source: Ambulatory Visit | Attending: Radiation Oncology | Admitting: Radiation Oncology

## 2016-04-30 DIAGNOSIS — Z510 Encounter for antineoplastic radiation therapy: Secondary | ICD-10-CM | POA: Diagnosis not present

## 2016-05-01 ENCOUNTER — Ambulatory Visit
Admission: RE | Admit: 2016-05-01 | Discharge: 2016-05-01 | Disposition: A | Discharge status: Home / Self Care | Payer: Managed Care, Other (non HMO) | Source: Ambulatory Visit | Attending: Radiation Oncology | Admitting: Radiation Oncology

## 2016-05-01 DIAGNOSIS — Z510 Encounter for antineoplastic radiation therapy: Secondary | ICD-10-CM | POA: Diagnosis not present

## 2016-05-04 ENCOUNTER — Ambulatory Visit
Admission: RE | Admit: 2016-05-04 | Discharge: 2016-05-04 | Disposition: A | Discharge status: Home / Self Care | Payer: Managed Care, Other (non HMO) | Source: Ambulatory Visit | Attending: Radiation Oncology | Admitting: Radiation Oncology

## 2016-05-04 DIAGNOSIS — Z510 Encounter for antineoplastic radiation therapy: Secondary | ICD-10-CM | POA: Diagnosis not present

## 2016-05-04 DIAGNOSIS — C50411 Malignant neoplasm of upper-outer quadrant of right female breast: Secondary | ICD-10-CM

## 2016-05-04 MED ORDER — SONAFINE EX EMUL
1.0000 "application " | Freq: Once | CUTANEOUS | Status: AC
  Administered 2016-05-04: 1 via TOPICAL

## 2016-05-04 NOTE — Progress Notes (Signed)
Patient has dry, peeling areas on her right breast.  Advised her to use triple antibiotic ointment on the peeling areas and sonafine on the remainder of her breast and benadryl cream as needed for itching.  Provided her with samples of triple antibiotic ointment and a refill of sonafine.

## 2016-05-05 ENCOUNTER — Ambulatory Visit
Admission: RE | Admit: 2016-05-05 | Discharge: 2016-05-05 | Disposition: A | Discharge status: Home / Self Care | Payer: Managed Care, Other (non HMO) | Source: Ambulatory Visit | Attending: Radiation Oncology | Admitting: Radiation Oncology

## 2016-05-05 ENCOUNTER — Encounter: Payer: Self-pay | Admitting: Radiation Oncology

## 2016-05-05 VITALS — BP 130/61 | HR 75 | Temp 98.5°F | Ht 66.0 in | Wt 172.7 lb

## 2016-05-05 DIAGNOSIS — C50411 Malignant neoplasm of upper-outer quadrant of right female breast: Secondary | ICD-10-CM

## 2016-05-05 DIAGNOSIS — Z510 Encounter for antineoplastic radiation therapy: Secondary | ICD-10-CM | POA: Diagnosis not present

## 2016-05-05 DIAGNOSIS — Z170 Estrogen receptor positive status [ER+]: Secondary | ICD-10-CM | POA: Diagnosis not present

## 2016-05-05 NOTE — Progress Notes (Signed)
  Radiation Oncology         (336) [phone] ________________________________  Name: [name] MRN: [id number]  Date: 05/05/2016  DOB: 09-20-1965  Weekly Radiation Therapy Management    ICD-9-CM ICD-10-CM   1. 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 . . . . . . . The patient presents for routine under treatment assessment. [name] has completed 24 fractions to her right chest wall.  She reports having pain in her right chest at a 6/10 today.  She denies having fatigue.  She is using sonafine cream, triple antibiotic ointment and benadryl cream for itching as needed.  The skin on her right subclavian, upper back and chest is red with dry peeling. She reports having itching which wakes her up periodically at night.    .                                 Set-up films were reviewed.                                 The chart was checked. Physical Findings. . .  height is 5\' 6"  (1.676 m) and weight is 172 lb 11.2 oz (78.3 kg). Her oral temperature is 98.5 F (36.9 C). Her blood pressure is 130/61 and her pulse is 75. Her oxygen saturation is 100%.   Lungs are clear to auscultation bilaterally. Heart has regular rate and rhythm. Radiation dermatitis in the upper inner aspect of the right chest also along the inframammary fold area. No moist desquamation, but dry desquamation.  Significant erythema throughout the right breast.  Impression . . . . . . . The patient is tolerating radiation. Plan . . . . . . . . . . . . Continue treatment as planned. ________________________________   Blair Promise, PhD, MD  This document serves as a record of services personally performed by Gery Pray, MD. It was created on his behalf by Truddie Hidden, a trained medical scribe. The creation of this record is based  on the scribe's personal observations and the provider's statements to them. This document has been checked and approved by the attending provider.

## 2016-05-05 NOTE — Progress Notes (Signed)
[name] has completed 24 fractions to her right chest wall.  She reports having pain in her right chest at a 6/10 today.  She denies having fatigue.  She is using sonafine cream, triple antibiotic ointment and benadryl cream for itching as needed.  The skin on her right subclavian, upper back and chest is red with dry peeling. She reports having itching.  BP 130/61 (BP Location: Left Arm, Patient Position: Sitting)   Pulse 75   Temp 98.5 F (36.9 C) (Oral)   Ht 5\' 6"  (1.676 m)   Wt 172 lb 11.2 oz (78.3 kg)   SpO2 100%   BMI 27.87 kg/m    Wt Readings from Last 3 Encounters:  05/05/16 172 lb 11.2 oz (78.3 kg)  04/28/16 174 lb 8 oz (79.2 kg)  04/21/16 174 lb 12.8 oz (79.3 kg)

## 2016-05-06 ENCOUNTER — Ambulatory Visit
Admission: RE | Admit: 2016-05-06 | Discharge: 2016-05-06 | Disposition: A | Discharge status: Home / Self Care | Payer: Managed Care, Other (non HMO) | Source: Ambulatory Visit | Attending: Radiation Oncology | Admitting: Radiation Oncology

## 2016-05-06 DIAGNOSIS — Z510 Encounter for antineoplastic radiation therapy: Secondary | ICD-10-CM | POA: Diagnosis not present

## 2016-05-07 ENCOUNTER — Ambulatory Visit
Admission: RE | Admit: 2016-05-07 | Discharge: 2016-05-07 | Disposition: A | Discharge status: Home / Self Care | Payer: Managed Care, Other (non HMO) | Source: Ambulatory Visit | Attending: Radiation Oncology | Admitting: Radiation Oncology

## 2016-05-07 DIAGNOSIS — Z510 Encounter for antineoplastic radiation therapy: Secondary | ICD-10-CM | POA: Diagnosis not present

## 2016-05-07 MED FILL — OTEZLA 30 MG TABS: 30 | 30 days supply | Qty: 60 | Fill #0

## 2016-05-08 ENCOUNTER — Ambulatory Visit
Admission: RE | Admit: 2016-05-08 | Discharge: 2016-05-08 | Disposition: A | Discharge status: Home / Self Care | Payer: Managed Care, Other (non HMO) | Source: Ambulatory Visit | Attending: Radiation Oncology | Admitting: Radiation Oncology

## 2016-05-08 DIAGNOSIS — Z510 Encounter for antineoplastic radiation therapy: Secondary | ICD-10-CM | POA: Diagnosis not present

## 2016-05-11 ENCOUNTER — Ambulatory Visit
Admission: RE | Admit: 2016-05-11 | Discharge: 2016-05-11 | Disposition: A | Discharge status: Home / Self Care | Payer: Managed Care, Other (non HMO) | Source: Ambulatory Visit | Attending: Radiation Oncology | Admitting: Radiation Oncology

## 2016-05-11 DIAGNOSIS — Z510 Encounter for antineoplastic radiation therapy: Secondary | ICD-10-CM | POA: Diagnosis not present

## 2016-05-12 ENCOUNTER — Encounter: Payer: Self-pay | Admitting: Radiation Oncology

## 2016-05-12 ENCOUNTER — Ambulatory Visit
Admission: RE | Admit: 2016-05-12 | Discharge: 2016-05-12 | Disposition: A | Discharge status: Home / Self Care | Payer: Managed Care, Other (non HMO) | Source: Ambulatory Visit | Attending: Radiation Oncology | Admitting: Radiation Oncology

## 2016-05-12 VITALS — BP 118/66 | HR 72 | Temp 98.1°F | Ht 66.0 in | Wt 172.9 lb

## 2016-05-12 DIAGNOSIS — C50411 Malignant neoplasm of upper-outer quadrant of right female breast: Secondary | ICD-10-CM

## 2016-05-12 DIAGNOSIS — Z510 Encounter for antineoplastic radiation therapy: Secondary | ICD-10-CM | POA: Diagnosis not present

## 2016-05-12 MED ORDER — SONAFINE EX EMUL
1.0000 "application " | Freq: Once | CUTANEOUS | Status: AC
  Administered 2016-05-12: 1 via TOPICAL

## 2016-05-12 NOTE — Progress Notes (Signed)
  Radiation Oncology         [phone] ________________________________  Name: [name] MRN: [id number]  Date: 05/12/2016  DOB: 12/12/1965  Weekly Radiation Therapy Management    ICD-9-CM ICD-10-CM   1. Breast cancer of upper-outer quadrant of right female breast ([place]) 174.4 C50.411      Current Dose: 53 Gy     Planned Dose:  55 Gy  Narrative . . . . . . . . The patient presents for routine under treatment assessment.                                  Adalicia has completed 29 fractions to her right chest wall.  She reports having pain/itching in her right chest/underarm area.  She reports having fatigue and is not sleeping well due to her right underarm skin irritation.  She is using sonafine.  The skin on her right shoulder, subclavian area and chest is red with dry peeling.  The skin under her right arm has dry desquamation.  She has been given a refill of sonafine and also hydrogel pad to try.                                 Set-up films were reviewed.                                 The chart was checked. Physical Findings. . .  height is 5\' 6"  (1.676 m) and weight is 172 lb 14.4 oz (78.4 kg). Her oral temperature is 98.1 F (36.7 C). Her blood pressure is 118/66 and her pulse is 72. Her oxygen saturation is 100%. . Weight essentially stable. The right axillary region shows some mild moist desquamation. No signs of infection. The remainder of the right chest shows diffuse erythema and dry desquamation Impression . . . . . . . The patient is tolerating radiation. Plan . . . . . . . . . . . . Continue treatment as planned. Patient will complete her treatment tomorrow and then follow-up in one week.  ________________________________   Blair Promise, PhD, MD

## 2016-05-12 NOTE — Addendum Note (Signed)
Encounter addended by: Jacqulyn Liner, RN on: 05/12/2016 11:53 AM<BR>    Actions taken: [facility] administration accepted

## 2016-05-12 NOTE — Addendum Note (Signed)
Encounter addended by: Jacqulyn Liner, RN on: 05/12/2016 11:07 AM<BR>    Actions taken: Order Entry activity accessed, Diagnosis association updated

## 2016-05-12 NOTE — Progress Notes (Signed)
[name] has completed 29 fractions to her right chest wall.  She reports having pain/itching in her right chest/underarm area.  She reports having fatigue and is not sleeping well due to her right underarm skin irritation.  She is using sonafine.  The skin on her right shoulder, subclavian area and chest is red with dry peeling.  The skin under her right arm has dry desquamation.  She has been given a refill of sonafine and also hydrogel pad to try.  BP 118/66 (BP Location: Left Arm, Patient Position: Sitting)   Pulse 72   Temp 98.1 F (36.7 C) (Oral)   Ht 5\' 6"  (1.676 m)   Wt 172 lb 14.4 oz (78.4 kg)   SpO2 100%   BMI 27.91 kg/m    Wt Readings from Last 3 Encounters:  05/12/16 172 lb 14.4 oz (78.4 kg)  05/05/16 172 lb 11.2 oz (78.3 kg)  04/28/16 174 lb 8 oz (79.2 kg)

## 2016-05-13 ENCOUNTER — Ambulatory Visit: Payer: Managed Care, Other (non HMO)

## 2016-05-13 ENCOUNTER — Encounter: Payer: Self-pay | Admitting: Radiation Oncology

## 2016-05-13 DIAGNOSIS — Z510 Encounter for antineoplastic radiation therapy: Secondary | ICD-10-CM | POA: Diagnosis not present

## 2016-05-14 ENCOUNTER — Ambulatory Visit: Payer: Managed Care, Other (non HMO)

## 2016-05-15 ENCOUNTER — Ambulatory Visit: Payer: Managed Care, Other (non HMO)

## 2016-05-18 ENCOUNTER — Other Ambulatory Visit: Payer: Self-pay | Admitting: *Deleted

## 2016-05-18 ENCOUNTER — Ambulatory Visit (HOSPITAL_BASED_OUTPATIENT_CLINIC_OR_DEPARTMENT_OTHER): Payer: Managed Care, Other (non HMO)

## 2016-05-18 ENCOUNTER — Ambulatory Visit: Payer: Managed Care, Other (non HMO)

## 2016-05-18 ENCOUNTER — Encounter: Payer: Self-pay | Admitting: Hematology and Oncology

## 2016-05-18 ENCOUNTER — Ambulatory Visit (HOSPITAL_BASED_OUTPATIENT_CLINIC_OR_DEPARTMENT_OTHER): Payer: Managed Care, Other (non HMO) | Admitting: Hematology and Oncology

## 2016-05-18 DIAGNOSIS — Z170 Estrogen receptor positive status [ER+]: Principal | ICD-10-CM

## 2016-05-18 DIAGNOSIS — C50411 Malignant neoplasm of upper-outer quadrant of right female breast: Secondary | ICD-10-CM

## 2016-05-18 NOTE — Progress Notes (Signed)
Patient Care Team: Shirline Frees, MD as PCP - General (Family Medicine) Bo Merino, MD as Referring Physician (Rheumatology) Sylvan Cheese, NP as Nurse Practitioner (Hematology and Oncology)  DIAGNOSIS: Breast cancer of upper-outer quadrant of right female breast [facility])   Staging form: Breast, AJCC 7th Edition   - Clinical stage from 11/13/2015: Stage IIA (T2, N0, M0) - Unsigned         Staging comments: Staged at breast conference on 3.29.17  SUMMARY OF ONCOLOGIC HISTORY:   Breast cancer of upper-outer quadrant of right female breast ([place])   11/07/2015 Initial Diagnosis    Right breast biopsy 12:30 position: Invasive ductal carcinoma grade 2, ER/PR positive, HER-2 negative ratio 1.17, Ki-67 7%, ultrasound revealed 2.7 cm mass, T2 N0 stage II a clinical stage      01/09/2016 Surgery    Right mastectomy: IDC, 2 tumors, 3.2 cm and 2.2 cm, 2/3 sentinel nodes positive, focal extranodal extension, 2 additional nodes negative, ER 100%, PR 90%, HER-2 negative ratio 1.16, Ki-67 10% and 7%, T2(multifocal) N1 a stage IIB      01/09/2016 Miscellaneous    Mammaprint: Luminal type A, low risk (did not need chemotherapy)      04/01/2016 - 05/12/2016 Radiation Therapy    Adjuvant radiation therapy       CHIEF COMPLIANT: Follow-up after radiation therapy  INTERVAL HISTORY: [name] is a 50 year old with above-mentioned history of right breast cancer treated with mastectomy and was found to have low risk Mammaprint even though she has 2 positive lymph nodes. She did not need systemic chemotherapy. She finished adjuvant radiation therapy and is here today to discuss the further adjuvant treatment plan. She is extremely sore from the effects of radiation. Her skin has broken down. She has many questions today regarding her prognosis.  REVIEW OF SYSTEMS:   Constitutional: Denies fevers, chills or abnormal weight loss Eyes: Denies blurriness of vision Ears, nose, mouth, throat,  and face: Denies mucositis or sore throat Respiratory: Denies cough, dyspnea or wheezes Cardiovascular: Denies palpitation, chest discomfort Gastrointestinal:  Denies nausea, heartburn or change in bowel habits Skin: Denies abnormal skin rashes Lymphatics: Denies new lymphadenopathy or easy bruising Neurological:Denies numbness, tingling or new weaknesses Behavioral/Psych: Mood is stable, no new changes  Extremities: No lower extremity edema  All other systems were reviewed with the patient and are negative.  I have reviewed the past medical history, past surgical history, social history and family history with the patient and they are unchanged from previous note.  ALLERGIES:  is allergic to xanax [alprazolam] and tape.  MEDICATIONS:  Current Outpatient Prescriptions  Medication Sig Dispense Refill  . diphenhydrAMINE-zinc acetate (BENADRYL) cream Apply topically 3 (three) times daily as needed for itching.    . hyaluronate sodium (RADIAPLEXRX) GEL Apply 1 application topically once.    Marland Kitchen LORazepam (ATIVAN) 1 MG tablet Take 1 tablet by mouth every 6 (six) hours as needed.    . Multiple Vitamins-Minerals (MULTIVITAMIN WITH MINERALS) tablet Take 1 tablet by mouth daily.    . non-metallic deodorant Jethro Poling) MISC Apply 1 application topically daily as needed.    . Saccharomyces boulardii (PROBIOTIC) 250 MG CAPS Take by mouth.    . Wound Dressings (SONAFINE EX) Apply topically.     No current facility-administered medications for this visit.     PHYSICAL EXAMINATION: ECOG PERFORMANCE STATUS: 1 - Symptomatic but completely ambulatory  Vitals:   05/18/16 1100  BP: (!) 115/54  Pulse: 77  Resp: 18  Temp: 98 F (  36.7 C)   Filed Weights   05/18/16 1100  Weight: 171 lb 4.8 oz (77.7 kg)    GENERAL:alert, no distress and comfortable SKIN: skin color, texture, turgor are normal, no rashes or significant lesions EYES: normal, Conjunctiva are pink and non-injected, sclera  clear OROPHARYNX:no exudate, no erythema and lips, buccal mucosa, and tongue normal  NECK: supple, thyroid normal size, non-tender, without nodularity LYMPH:  no palpable lymphadenopathy in the cervical, axillary or inguinal LUNGS: clear to auscultation and percussion with normal breathing effort HEART: regular rate & rhythm and no murmurs and no lower extremity edema ABDOMEN:abdomen soft, non-tender and normal bowel sounds MUSCULOSKELETAL:no cyanosis of digits and no clubbing  NEURO: alert & oriented x 3 with fluent speech, no focal motor/sensory deficits EXTREMITIES: No lower extremity edema BREAST:Radiation dermatitis (exam performed in the presence of a chaperone)  LABORATORY DATA:  I have reviewed the data as listed   Chemistry      Component Value Date/Time   NA 139 01/11/2016 0048   NA 138 11/21/2015 1433   K 4.8 01/11/2016 0048   K 3.9 11/21/2015 1433   CL 101 01/11/2016 0048   CO2 32 01/11/2016 0048   CO2 26 11/21/2015 1433   BUN 13 01/11/2016 0048   BUN 9.9 11/21/2015 1433   CREATININE 0.62 01/11/2016 0048   CREATININE 0.7 11/21/2015 1433      Component Value Date/Time   CALCIUM 9.1 01/11/2016 0048   CALCIUM 9.3 11/21/2015 1433   ALKPHOS 83 01/02/2016 1025   ALKPHOS 95 11/21/2015 1433   AST 67 (H) 01/02/2016 1025   AST 114 (H) 11/21/2015 1433   ALT 46 01/02/2016 1025   ALT 68 (H) 11/21/2015 1433   BILITOT 1.2 01/02/2016 1025   BILITOT 1.47 (H) 11/21/2015 1433       Lab Results  Component Value Date   WBC 11.9 (H) 01/11/2016   HGB 11.2 (L) 01/11/2016   HCT 33.8 (L) 01/11/2016   MCV 94.9 01/11/2016   PLT 139 (L) 01/11/2016   NEUTROABS 3.8 01/02/2016     ASSESSMENT & PLAN:  Breast cancer of upper-outer quadrant of right female breast ([place]) Right mastectomy 01/09/2016: IDC, 2 tumors, 3.2 cm and 2.2 cm, 2/3 sentinel nodes positive, focal extranodal extension, 2 additional nodes negative, ER 100%, PR 90%, HER-2 negative ratio 1.16, Ki-67 10% and 7%,  T2(multifocal) N1 a stage IIB Mammaprint: Luminal type A , low risk Adjuvant radiation therapy 04/01/2016 to 05/12/2016   Treatment plan: Adjuvant antiestrogen therapy Between tamoxifen versus anastrozole. Since the patient had hysterectomy, she does not have she is in menopause. We will send for [facility] and estradiol today. I will call in a prescription for tamoxifen if she is premenopausal and anastrozole if she is postmenopausal.  Antiestrogen therapy counseling: We discussed the risks and benefits of anti-estrogen therapy with aromatase inhibitors and tamoxifen. These include but not limited to insomnia, hot flashes, mood changes, vaginal dryness, bone density loss for AI, and weight gain.  Risk of DVT with tamoxifen was also discussed. We strongly believe that the benefits far outweigh the risks. Patient understands these risks and consented to starting treatment. Planned treatment duration is 5-10 years.  PALLAS clinical trial counseling: Patients who have completed definitive therapy for breast cancer are randomized to antiestrogen therapy (5+ years) versus antiestrogen therapy plus Palbociclib (2 years). Palbociclib: If she was randomized to Palbociclib, I discussed the risks and benefits of Ibrance including myelosuppression especially neutropenia and with that risk of infection, there is  risk of pulmonary embolism and mild peripheral neuropathy as well. Fatigue, nausea, diarrhea, decreased appetite as well as alopecia and thrombocytopenia are also potential side effects of Palbociclib.  Return to clinic in 3 months for follow-up.  No orders of the defined types were placed in this encounter.  The patient has a good understanding of the overall plan. she agrees with it. she will call with any problems that may develop before the next visit here.   Rulon Eisenmenger, MD 05/18/16

## 2016-05-18 NOTE — Assessment & Plan Note (Signed)
Right mastectomy 01/09/2016: IDC, 2 tumors, 3.2 cm and 2.2 cm, 2/3 sentinel nodes positive, focal extranodal extension, 2 additional nodes negative, ER 100%, PR 90%, HER-2 negative ratio 1.16, Ki-67 10% and 7%, T2(multifocal) N1 a stage IIB Mammaprint: Luminal type A , low risk Adjuvant radiation therapy 04/01/2016 to 05/12/2016   Treatment plan: Adjuvant antiestrogen therapy  We discussed the risks and benefits of anti-estrogen therapy with aromatase inhibitors. These include but not limited to insomnia, hot flashes, mood changes, vaginal dryness, bone density loss, and weight gain. We strongly believe that the benefits far outweigh the risks. Patient understands these risks and consented to starting treatment. Planned treatment duration is 5 years.  Return to clinic in 3 months for follow-up

## 2016-05-19 ENCOUNTER — Ambulatory Visit
Admission: RE | Admit: 2016-05-19 | Payer: Managed Care, Other (non HMO) | Source: Ambulatory Visit | Admitting: Radiation Oncology

## 2016-05-19 ENCOUNTER — Encounter: Payer: Self-pay | Admitting: Oncology

## 2016-05-19 LAB — FOLLICLE STIMULATING HORMONE: FSH: 50.9 m[IU]/mL

## 2016-05-19 NOTE — Progress Notes (Signed)
  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 2) Right chest wall scar boost: 10 Gy in 5 fractions.  Beams/energy:   1) 3D // 10X, 6X Photon 2) En Face // 6 MeV Electron  Narrative: The patient tolerated radiation treatment relatively well. Towards the end of treatment, the patient experienced pain, discomfort, and pruritus in the treatment areas. She also had fatigue. She was using sonafine cream, triple antibiotic ointment, and benadryl cream for the pruritus as needed. The patient experienced radiation dermatitis in the upper inner aspect of the right chest also along the inframammary fold area. The right axillary region showed some mild moist desquamation with the right chest showing diffuse erythema and dry desquamation.  Plan: The patient has completed radiation treatment. The patient will return to radiation oncology clinic for routine followup in [place]. I advised them to call or return sooner if they have any questions or concerns related to their recovery or treatment.  -----------------------------------  Blair Promise, PhD, MD  This document serves as a record of services personally performed by Gery Pray, MD. It was created on his behalf by Darcus Austin, a trained medical scribe. The creation of this record is based on the scribe's personal observations and the provider's statements to them. This document has been checked and approved by the attending provider.

## 2016-05-20 ENCOUNTER — Encounter: Payer: Self-pay | Admitting: Radiation Oncology

## 2016-05-20 ENCOUNTER — Ambulatory Visit
Admission: RE | Admit: 2016-05-20 | Discharge: 2016-05-20 | Disposition: A | Discharge status: Home / Self Care | Payer: Managed Care, Other (non HMO) | Source: Ambulatory Visit | Attending: Radiation Oncology | Admitting: Radiation Oncology

## 2016-05-20 VITALS — BP 117/64 | HR 71 | Temp 98.2°F | Ht 66.0 in | Wt 171.8 lb

## 2016-05-20 DIAGNOSIS — C50411 Malignant neoplasm of upper-outer quadrant of right female breast: Secondary | ICD-10-CM

## 2016-05-20 DIAGNOSIS — Z170 Estrogen receptor positive status [ER+]: Principal | ICD-10-CM

## 2016-05-20 DIAGNOSIS — Z510 Encounter for antineoplastic radiation therapy: Secondary | ICD-10-CM | POA: Diagnosis not present

## 2016-05-20 MED ORDER — SONAFINE EX EMUL
1.0000 "application " | Freq: Once | CUTANEOUS | Status: AC
  Administered 2016-05-20: 1 via TOPICAL

## 2016-05-20 NOTE — Progress Notes (Signed)
[name] is here for follow up after treatment to her right chest wall.  She denies having pain.  She reports having fatigue and is not sleeping well at night due to arthritis pain.  She is using sonafine and will be given a refill. She also reports that the hydrogel pads really helped her right underarm area heal.  The skin on her right shoulder is peeling.  The skin on her right chest wall and underarm area has peeled and is red.   BP 117/64 (BP Location: Left Arm, Patient Position: Sitting)   Pulse 71   Temp 98.2 F (36.8 C) (Oral)   Ht 5\' 6"  (1.676 m)   Wt 171 lb 12.8 oz (77.9 kg)   SpO2 100%   BMI 27.73 kg/m    Wt Readings from Last 3 Encounters:  05/20/16 171 lb 12.8 oz (77.9 kg)  05/18/16 171 lb 4.8 oz (77.7 kg)  05/12/16 172 lb 14.4 oz (78.4 kg)

## 2016-05-20 NOTE — Progress Notes (Signed)
  Radiation Oncology         [phone] ________________________________  Name: [name] MRN: [id number]  Date: 05/20/2016  DOB: August 01, 1966  Follow-Up Visit Note  CC: Shirline Frees, MD  Nicholas Lose, MD    ICD-9-CM ICD-10-CM   1. 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 She denies having pain. She reports having fatigue and is not sleeping well at night due to arthritis pain. She is using Sonafine and will be given a refill. She also reports that the hydrogel pads really helped her right underarm area heal. The skin on her right shoulder is peeling. The skin on her right chest wall and underarm area has healed and is red.                             ALLERGIES:  is allergic to xanax [alprazolam] and tape.  Meds: Current Outpatient Prescriptions  Medication Sig Dispense Refill  . LORazepam (ATIVAN) 1 MG tablet Take 1 tablet by mouth every 6 (six) hours as needed.    . Multiple Vitamins-Minerals (MULTIVITAMIN WITH MINERALS) tablet Take 1 tablet by mouth daily.    . hyaluronate sodium (RADIAPLEXRX) GEL Apply 1 application topically once.    . Saccharomyces boulardii (PROBIOTIC) 250 MG CAPS Take by mouth.    . Wound Dressings (SONAFINE EX) Apply topically.     Current Facility-Administered Medications  Medication Dose Route Frequency Provider Last Rate Last Dose  . SONAFINE emulsion 1 application  1 application Topical Once Gery Pray, MD        Physical Findings: The patient is in no acute distress. Patient is alert and oriented.  height is '5\' 6"'$  (1.676 m) and weight is 171 lb 12.8 oz  (77.9 kg). Her oral temperature is 98.2 F (36.8 C). Her blood pressure is 117/64 and her pulse is 71. Her oxygen saturation is 100%. .  Chest:: continues erythema in the right chest area and dry desquamation but no moist desquamtion  Lab Findings: Lab Results  Component Value Date   WBC 11.9 (H) 01/11/2016   HGB 11.2 (L) 01/11/2016   HCT 33.8 (L) 01/11/2016   MCV 94.9 01/11/2016   PLT 139 (L) 01/11/2016    Radiographic Findings: No results found.  Impression:  The patient is recovering from the effects of radiation.  No signs of infection. The patient will continue using Sonafine.  Plan:  Return in 4 weeks for additional skin check.  ____________________________________ This document serves as a record of services personally performed by Gery Pray, MD. It was created on his behalf by Bethann Humble, a trained medical scribe. The creation of this record is based on the scribe's personal observations and the provider's statements to them. This document has been checked and approved by the attending provider.

## 2016-05-22 ENCOUNTER — Ambulatory Visit: Payer: Self-pay | Admitting: Plastic Surgery

## 2016-05-22 ENCOUNTER — Encounter (HOSPITAL_COMMUNITY): Payer: Self-pay | Admitting: *Deleted

## 2016-05-22 DIAGNOSIS — L723 Sebaceous cyst: Secondary | ICD-10-CM

## 2016-05-22 NOTE — Progress Notes (Signed)
Pt denies SOB, chest pain, and being under the care of a cardiologist. Pt denies having a stress test, echo and cardiac cath. Pt denies having a chest x ray within the last year. Pt made aware to stop taking  Aspirin, vitamins, fish oil and herbal medications ( probiotics). Do not take any NSAIDs ie: Ibuprofen, Advil, Naproxen, BC and Goody Powder or any medication containing Aspirin. Pt verbalized understanding of all pre-op instructions.

## 2016-05-25 ENCOUNTER — Ambulatory Visit (HOSPITAL_COMMUNITY)
Admission: RE | Disposition: A | Discharge status: Home / Self Care | Payer: Self-pay | Source: Ambulatory Visit | Attending: Plastic Surgery

## 2016-05-25 ENCOUNTER — Ambulatory Visit (HOSPITAL_COMMUNITY): Payer: 59 | Admitting: Anesthesiology

## 2016-05-25 ENCOUNTER — Encounter (HOSPITAL_COMMUNITY): Payer: Self-pay | Admitting: Anesthesiology

## 2016-05-25 ENCOUNTER — Ambulatory Visit (HOSPITAL_COMMUNITY)
Admission: RE | Admit: 2016-05-25 | Discharge: 2016-05-25 | Disposition: A | Discharge status: Home / Self Care | Payer: 59 | Source: Ambulatory Visit | Attending: Plastic Surgery | Admitting: Plastic Surgery

## 2016-05-25 DIAGNOSIS — L723 Sebaceous cyst: Secondary | ICD-10-CM | POA: Diagnosis present

## 2016-05-25 DIAGNOSIS — L4050 Arthropathic psoriasis, unspecified: Secondary | ICD-10-CM | POA: Insufficient documentation

## 2016-05-25 DIAGNOSIS — Z923 Personal history of irradiation: Secondary | ICD-10-CM | POA: Insufficient documentation

## 2016-05-25 DIAGNOSIS — Z853 Personal history of malignant neoplasm of breast: Secondary | ICD-10-CM | POA: Diagnosis not present

## 2016-05-25 DIAGNOSIS — I10 Essential (primary) hypertension: Secondary | ICD-10-CM | POA: Diagnosis not present

## 2016-05-25 DIAGNOSIS — Z800 Family history of malignant neoplasm of digestive organs: Secondary | ICD-10-CM | POA: Diagnosis not present

## 2016-05-25 DIAGNOSIS — F419 Anxiety disorder, unspecified: Secondary | ICD-10-CM | POA: Insufficient documentation

## 2016-05-25 HISTORY — DX: Sebaceous cyst: L72.3

## 2016-05-25 HISTORY — PX: MASS EXCISION: SHX2000

## 2016-05-25 LAB — CBC
HCT: 37.7 % (ref 36.0–46.0)
Hemoglobin: 12.3 g/dL (ref 12.0–15.0)
MCH: 30.4 pg (ref 26.0–34.0)
MCHC: 32.6 g/dL (ref 30.0–36.0)
MCV: 93.3 fL (ref 78.0–100.0)
PLATELETS: 125 10*3/uL — AB (ref 150–400)
RBC: 4.04 MIL/uL (ref 3.87–5.11)
RDW: 14.4 % (ref 11.5–15.5)
WBC: 5.2 10*3/uL (ref 4.0–10.5)

## 2016-05-25 LAB — BASIC METABOLIC PANEL
ANION GAP: 5 (ref 5–15)
BUN: 8 mg/dL (ref 6–20)
CALCIUM: 9 mg/dL (ref 8.9–10.3)
CO2: 26 mmol/L (ref 22–32)
Chloride: 108 mmol/L (ref 101–111)
Creatinine, Ser: 0.54 mg/dL (ref 0.44–1.00)
GFR calc Af Amer: >60 mL/min (ref >60)
GLUCOSE: 101 mg/dL — AB (ref 65–99)
POTASSIUM: 3.8 mmol/L (ref 3.5–5.1)
SODIUM: 139 mmol/L (ref 135–145)

## 2016-05-25 SURGERY — EXCISION MASS
Anesthesia: General | Site: Chest

## 2016-05-25 MED ORDER — CHLORHEXIDINE GLUCONATE CLOTH 2 % EX PADS: 6.0000 | MEDICATED_PAD | Freq: Once | CUTANEOUS | Status: DC

## 2016-05-25 MED ORDER — LIDOCAINE HCL (CARDIAC) 20 MG/ML IV SOLN
INTRAVENOUS | Status: DC | PRN
  Administered 2016-05-25: 100 mg via INTRAVENOUS

## 2016-05-25 MED ORDER — 0.9 % SODIUM CHLORIDE (POUR BTL) OPTIME
TOPICAL | Status: DC | PRN
  Administered 2016-05-25: 1000 mL

## 2016-05-25 MED ORDER — MIDAZOLAM HCL 2 MG/2ML IJ SOLN
INTRAMUSCULAR | Status: AC
Start: 2016-05-25 — End: 2016-05-25
  Filled 2016-05-25: qty 2

## 2016-05-25 MED ORDER — MEPERIDINE HCL 25 MG/ML IJ SOLN: 6.2500 mg | INTRAMUSCULAR | Status: DC | PRN

## 2016-05-25 MED ORDER — PROPOFOL 10 MG/ML IV BOLUS
INTRAVENOUS | Status: DC | PRN
  Administered 2016-05-25: 200 mg via INTRAVENOUS

## 2016-05-25 MED ORDER — CEFAZOLIN SODIUM-DEXTROSE 2-4 GM/100ML-% IV SOLN
INTRAVENOUS | Status: AC
  Filled 2016-05-25: qty 100

## 2016-05-25 MED ORDER — SODIUM CHLORIDE 0.9 % IR SOLN
Status: DC | PRN
  Administered 2016-05-25: 500 mL

## 2016-05-25 MED ORDER — PROPOFOL 10 MG/ML IV BOLUS
INTRAVENOUS | Status: AC
  Filled 2016-05-25: qty 20

## 2016-05-25 MED ORDER — LACTATED RINGERS IV SOLN
INTRAVENOUS | Status: DC
  Administered 2016-05-25: 50 mL/h via INTRAVENOUS

## 2016-05-25 MED ORDER — FENTANYL CITRATE (PF) 100 MCG/2ML IJ SOLN
INTRAMUSCULAR | Status: AC
  Filled 2016-05-25: qty 2

## 2016-05-25 MED ORDER — DEXAMETHASONE SODIUM PHOSPHATE 10 MG/ML IJ SOLN
INTRAMUSCULAR | Status: AC
  Filled 2016-05-25: qty 1

## 2016-05-25 MED ORDER — CEFAZOLIN SODIUM-DEXTROSE 2-4 GM/100ML-% IV SOLN
2.0000 g | INTRAVENOUS | Status: AC
  Administered 2016-05-25: 2 g via INTRAVENOUS

## 2016-05-25 MED ORDER — HYDROMORPHONE HCL 1 MG/ML IJ SOLN
INTRAMUSCULAR | Status: DC
Start: 2016-05-25 — End: 2016-05-25
  Filled 2016-05-25: qty 1

## 2016-05-25 MED ORDER — FENTANYL CITRATE (PF) 100 MCG/2ML IJ SOLN
INTRAMUSCULAR | Status: DC | PRN
Start: 2016-05-25 — End: 2016-05-25
  Administered 2016-05-25 (×2): 50 ug via INTRAVENOUS

## 2016-05-25 MED ORDER — LIDOCAINE-EPINEPHRINE 1 %-1:100000 IJ SOLN
INTRAMUSCULAR | Status: DC | PRN
  Administered 2016-05-25: 3 mL

## 2016-05-25 MED ORDER — LIDOCAINE 2% (20 MG/ML) 5 ML SYRINGE
INTRAMUSCULAR | Status: AC
  Filled 2016-05-25: qty 5

## 2016-05-25 MED ORDER — MIDAZOLAM HCL 5 MG/5ML IJ SOLN
INTRAMUSCULAR | Status: DC | PRN
  Administered 2016-05-25 (×2): 1 mg via INTRAVENOUS

## 2016-05-25 MED ORDER — ONDANSETRON HCL 4 MG/2ML IJ SOLN
INTRAMUSCULAR | Status: DC | PRN
  Administered 2016-05-25: 4 mg via INTRAVENOUS

## 2016-05-25 MED ORDER — PROMETHAZINE HCL 25 MG/ML IJ SOLN: 6.2500 mg | INTRAMUSCULAR | Status: DC | PRN

## 2016-05-25 MED ORDER — ONDANSETRON HCL 4 MG/2ML IJ SOLN
INTRAMUSCULAR | Status: AC
  Filled 2016-05-25: qty 2

## 2016-05-25 MED ORDER — HYDROMORPHONE HCL 1 MG/ML IJ SOLN
0.2500 mg | INTRAMUSCULAR | Status: DC | PRN
  Administered 2016-05-25: 0.5 mg via INTRAVENOUS

## 2016-05-25 MED ORDER — DEXAMETHASONE SODIUM PHOSPHATE 10 MG/ML IJ SOLN
INTRAMUSCULAR | Status: DC | PRN
  Administered 2016-05-25: 8 mg via INTRAVENOUS

## 2016-05-25 SURGICAL SUPPLY — 50 items
APL SKNCLS STERI-STRIP NONHPOA (GAUZE/BANDAGES/DRESSINGS)
APPLICATOR COTTON TIP 6IN STRL (MISCELLANEOUS) IMPLANT
BAG DECANTER FOR FLEXI CONT (MISCELLANEOUS) ×2 IMPLANT
BENZOIN TINCTURE PRP APPL 2/3 (GAUZE/BANDAGES/DRESSINGS) ×1 IMPLANT
BINDER BREAST XLRG (GAUZE/BANDAGES/DRESSINGS) ×2 IMPLANT
CANISTER SUCTION 2500CC (MISCELLANEOUS) ×3 IMPLANT
CONT SPEC STER OR (MISCELLANEOUS) ×2 IMPLANT
COVER SURGICAL LIGHT HANDLE (MISCELLANEOUS) ×3 IMPLANT
DRAPE IMP U-DRAPE 54X76 (DRAPES) ×3 IMPLANT
DRAPE INCISE IOBAN 66X45 STRL (DRAPES) IMPLANT
DRAPE LAPAROSCOPIC ABDOMINAL (DRAPES) IMPLANT
DRAPE LAPAROTOMY T 98X78 PEDS (DRAPES) ×3 IMPLANT
DRAPE PROXIMA HALF (DRAPES) IMPLANT
DRESSING HYDROCOLLOID 4X4 (GAUZE/BANDAGES/DRESSINGS) ×1 IMPLANT
DRSG ADAPTIC 3X8 NADH LF (GAUZE/BANDAGES/DRESSINGS) IMPLANT
DRSG PAD ABDOMINAL 8X10 ST (GAUZE/BANDAGES/DRESSINGS) IMPLANT
DRSG VAC ATS LRG SENSATRAC (GAUZE/BANDAGES/DRESSINGS) IMPLANT
DRSG VAC ATS MED SENSATRAC (GAUZE/BANDAGES/DRESSINGS) IMPLANT
DRSG VAC ATS SM SENSATRAC (GAUZE/BANDAGES/DRESSINGS) IMPLANT
ELECT CAUTERY BLADE 6.4 (BLADE) ×2 IMPLANT
ELECT REM PT RETURN 9FT ADLT (ELECTROSURGICAL) ×3
ELECTRODE REM PT RTRN 9FT ADLT (ELECTROSURGICAL) ×1 IMPLANT
GAUZE SPONGE 4X4 12PLY STRL (GAUZE/BANDAGES/DRESSINGS) ×2 IMPLANT
GAUZE XEROFORM 1X8 LF (GAUZE/BANDAGES/DRESSINGS) ×2 IMPLANT
GLOVE BIO SURGEON STRL SZ 6.5 (GLOVE) ×2 IMPLANT
GLOVE BIO SURGEONS STRL SZ 6.5 (GLOVE) ×1
GLOVE INDICATOR 7.0 STRL GRN (GLOVE) ×2 IMPLANT
GLOVE SURG SS PI 7.0 STRL IVOR (GLOVE) ×2 IMPLANT
GOWN STRL REUS W/ TWL LRG LVL3 (GOWN DISPOSABLE) ×3 IMPLANT
GOWN STRL REUS W/TWL LRG LVL3 (GOWN DISPOSABLE) ×9
KIT BASIN OR (CUSTOM PROCEDURE TRAY) ×3 IMPLANT
KIT ROOM TURNOVER OR (KITS) ×3 IMPLANT
NDL ECHOTIP HI DEF 22GA (NEEDLE) IMPLANT
NDL HYPO 25GX1X1/2 BEV (NEEDLE) ×1 IMPLANT
NEEDLE ECHOTIP HI DEF 22GA (NEEDLE) ×6 IMPLANT
NEEDLE HYPO 25GX1X1/2 BEV (NEEDLE) ×6 IMPLANT
NS IRRIG 1000ML POUR BTL (IV SOLUTION) ×3 IMPLANT
PACK GENERAL/GYN (CUSTOM PROCEDURE TRAY) ×3 IMPLANT
PACK UNIVERSAL I (CUSTOM PROCEDURE TRAY) ×3 IMPLANT
PAD ARMBOARD 7.5X6 YLW CONV (MISCELLANEOUS) ×6 IMPLANT
SPONGE GAUZE 4X4 12PLY STER LF (GAUZE/BANDAGES/DRESSINGS) ×4 IMPLANT
STAPLER VISISTAT 35W (STAPLE) ×1 IMPLANT
SURGILUBE 2OZ TUBE FLIPTOP (MISCELLANEOUS) IMPLANT
SUT MNCRL AB 4-0 PS2 18 (SUTURE) IMPLANT
SUT VIC AB 5-0 PS2 18 (SUTURE) IMPLANT
SWAB COLLECTION DEVICE MRSA (MISCELLANEOUS) IMPLANT
SYR CONTROL 10ML LL (SYRINGE) ×3 IMPLANT
TOWEL OR 17X26 10 PK STRL BLUE (TOWEL DISPOSABLE) ×3 IMPLANT
TUBE ANAEROBIC SPECIMEN COL (MISCELLANEOUS) ×4 IMPLANT
UNDERPAD 30X30 (UNDERPADS AND DIAPERS) ×3 IMPLANT

## 2016-05-25 NOTE — H&P (Signed)
[name] is an 50 y.o. female.   Chief Complaint: cyst HPI: The patient is a 50 yrs old wf here for treatment of a chest cyst.  She has been treated for breast cancer and undergone reconstruction and radiation.  She developed the cyst over the past several months.  It has gotten very tender, red and swollen.  Antibiotics were started last week. The decision was made for excision.  Past Medical History:  Diagnosis Date  . Anxiety   . Breast cancer ([place])   . History of radiation therapy 04/01/16-05/13/16   right chest wall 45 Gy, right chest wall boost 10 Gy  . HTN (hypertension)    no meds now  . Psoriatic arthritis ([place])   . Sebaceous cyst    chest    Past Surgical History:  Procedure Laterality Date  . BREAST RECONSTRUCTION WITH PLACEMENT OF TISSUE EXPANDER AND FLEX HD (ACELLULAR HYDRATED DERMIS) Right 01/09/2016   Procedure: BREAST RECONSTRUCTION WITH PLACEMENT OF TISSUE EXPANDER AND FLEX HD (ACELLULAR HYDRATED DERMIS);  Surgeon: Loel Lofty Kamar Callender, DO;  Location: [place];  Service: Plastics;  Laterality: Right;  . MASTOPEXY Left 02/27/2016   Procedure: LEFT BREAST MASTOPEXY;  Surgeon: Wallace Going, DO;  Location: [place];  Service: Plastics;  Laterality: Left;  . REMOVAL OF TISSUE EXPANDER AND PLACEMENT OF IMPLANT Right 02/27/2016   Procedure: REMOVAL OF RIGHT  TISSUE EXPANDERS WITH PLACEMENT OF RIGHT  BREAST IMPLANTS ;  Surgeon: Wallace Going, DO;  Location: [place];  Service: Plastics;  Laterality: Right;  . SIMPLE MASTECTOMY WITH AXILLARY SENTINEL NODE BIOPSY Right 01/09/2016   Procedure: INJECTION BLUE DYE RIGHT BREAST,  TOTAL MASTECTOMY WITH  RIGHT AXILLARY SENTINEL NODE BIOPSY;  Surgeon: Fanny Skates, MD;  Location: [place];  Service: General;  Laterality: Right;  . TONSILLECTOMY    . TOTAL ABDOMINAL HYSTERECTOMY      Family History  Problem Relation Age of Onset  . Cancer - Colon Cousin    Social History:  reports that she  has never smoked. She has never used smokeless tobacco. She reports that she does not drink alcohol or use drugs.  Allergies:  Allergies  Allergen Reactions  . Xanax [Alprazolam] Anaphylaxis and Hives  . Tape Other (See Comments)    Pulled skin off     Medications Prior to Admission  Medication Sig Dispense Refill  . LORazepam (ATIVAN) 1 MG tablet Take 1 mg by mouth every 6 (six) hours as needed for anxiety.     . Multiple Vitamins-Minerals (MULTIVITAMIN WITH MINERALS) tablet Take 1 tablet by mouth daily.    . Saccharomyces boulardii (PROBIOTIC) 250 MG CAPS Take 250 mg by mouth daily.     . Wound Dressings (SONAFINE EX) Apply topically.      Results for orders placed or performed during the hospital encounter of 05/25/16 (from the past 48 hour(s))  CBC     Status: Abnormal   Collection Time: 05/25/16  7:16 AM  Result Value Ref Range   WBC 5.2 4.0 - 10.5 K/uL   RBC 4.04 3.87 - 5.11 MIL/uL   Hemoglobin 12.3 12.0 - 15.0 g/dL   HCT 37.7 36.0 - 46.0 %   MCV 93.3 78.0 - 100.0 fL   MCH 30.4 26.0 - 34.0 pg   MCHC 32.6 30.0 - 36.0 g/dL   RDW 14.4 11.5 - 15.5 %   Platelets 125 (L) 150 - 400 K/uL  Basic metabolic panel     Status: Abnormal  Collection Time: 05/25/16  7:16 AM  Result Value Ref Range   Sodium 139 135 - 145 mmol/L   Potassium 3.8 3.5 - 5.1 mmol/L   Chloride 108 101 - 111 mmol/L   CO2 26 22 - 32 mmol/L   Glucose, Bld 101 (H) 65 - 99 mg/dL   BUN 8 6 - 20 mg/dL   Creatinine, Ser 0.54 0.44 - 1.00 mg/dL   Calcium 9.0 8.9 - 10.3 mg/dL   GFR calc non Af Amer >60 >60 mL/min   GFR calc Af Amer >60 >60 mL/min    Comment: (NOTE) The eGFR has been calculated using the CKD EPI equation. This calculation has not been validated in all clinical situations. eGFR's persistently <60 mL/min signify possible Chronic Kidney Disease.    Anion gap 5 5 - 15   No results found.  Review of Systems  Constitutional: Negative.   HENT: Negative.   Eyes: Negative.   Respiratory:  Negative.   Cardiovascular: Negative.   Gastrointestinal: Negative.   Genitourinary: Negative.   Musculoskeletal: Negative.   Skin: Negative.   Neurological: Negative.   Psychiatric/Behavioral: Negative.     Blood pressure 127/60, pulse 73, temperature 98.1 F (36.7 C), temperature source Oral, resp. rate 18, weight 77.6 kg (171 lb), SpO2 97 %. Physical Exam  Constitutional: She is oriented to person, place, and time. She appears well-developed and well-nourished.  HENT:  Head: Normocephalic and atraumatic.  Eyes: EOM are normal. Pupils are equal, round, and reactive to light.  Cardiovascular: Normal rate.   Respiratory: Effort normal.  Neurological: She is alert and oriented to person, place, and time.  Skin: Skin is warm. There is erythema.  Psychiatric: She has a normal mood and affect. Her behavior is normal. Judgment and thought content normal.     Assessment/Plan Excision of chest sebaceous cyt.  Wallace Going, DO 05/25/2016, 8:48 AM

## 2016-05-25 NOTE — Transfer of Care (Signed)
Immediate Anesthesia Transfer of Care Note  Patient: [name]  Procedure(s) Performed: Procedure(s): EXCISION sebaceous cyst (N/A)  Patient Location: PACU  Anesthesia Type:General  Level of Consciousness: awake, alert , oriented and patient cooperative  Airway & Oxygen Therapy: Patient Spontanous Breathing and Patient connected to nasal cannula oxygen  Post-op Assessment: Report given to RN, Post -op Vital signs reviewed and stable and Patient moving all extremities  Post vital signs: Reviewed and stable  Last Vitals:  Vitals:   05/25/16 0707 05/25/16 1022  BP: 127/60   Pulse: 73   Resp: 18   Temp: 36.7 C (P) 36.4 C    Last Pain:  Vitals:   05/25/16 0728  TempSrc:   PainSc: 5       Patients Stated Pain Goal: 3 ([id number])  Complications: No apparent anesthesia complications

## 2016-05-25 NOTE — Discharge Instructions (Signed)
Change outer dressing daily.  Apply vaseline to the skin daily. May shower tomorrow.

## 2016-05-25 NOTE — Anesthesia Postprocedure Evaluation (Signed)
Anesthesia Post Note  Patient: [name]  Procedure(s) Performed: Procedure(s) (LRB): EXCISION sebaceous cyst (N/A)  Patient location during evaluation: PACU Anesthesia Type: General Level of consciousness: sedated and patient cooperative Pain management: pain level controlled Vital Signs Assessment: post-procedure vital signs reviewed and stable Respiratory status: spontaneous breathing Cardiovascular status: stable Anesthetic complications: no    Last Vitals:  Vitals:   05/25/16 1138 05/25/16 1150  BP:  (!) 117/56  Pulse: 70 68  Resp: 17   Temp: 36.7 C     Last Pain:  Vitals:   05/25/16 1045  TempSrc:   PainSc: [place]

## 2016-05-25 NOTE — Anesthesia Preprocedure Evaluation (Signed)
Anesthesia Evaluation  Patient identified by MRN, date of birth, ID band Patient awake    Reviewed: Allergy & Precautions, NPO status , Patient's Chart, lab work & pertinent test results  Airway Mallampati: II   Neck ROM: full    Dental   Pulmonary neg pulmonary ROS,    breath sounds clear to auscultation       Cardiovascular hypertension,  Rhythm:regular Rate:Normal     Neuro/Psych Anxiety    GI/Hepatic   Endo/Other    Renal/GU      Musculoskeletal   Abdominal   Peds  Hematology   Anesthesia Other Findings   Reproductive/Obstetrics                             Anesthesia Physical  Anesthesia Plan  ASA: II  Anesthesia Plan: General   Post-op Pain Management:    Induction: Intravenous  Airway Management Planned: LMA  Additional Equipment:   Intra-op Plan:   Post-operative Plan: Extubation in OR  Informed Consent: I have reviewed the patients History and Physical, chart, labs and discussed the procedure including the risks, benefits and alternatives for the proposed anesthesia with the patient or authorized representative who has indicated his/her understanding and acceptance.   Dental advisory given  Plan Discussed with: CRNA  Anesthesia Plan Comments:         Anesthesia Quick Evaluation

## 2016-05-25 NOTE — Anesthesia Procedure Notes (Signed)
Procedure Name: LMA Insertion Date/Time: 05/25/2016 9:38 AM Performed by: Williemae Area B Pre-anesthesia Checklist: Patient identified, Emergency Drugs available, Suction available and Patient being monitored Patient Re-evaluated:Patient Re-evaluated prior to inductionOxygen Delivery Method: Circle system utilized Preoxygenation: Pre-oxygenation with 100% oxygen Intubation Type: IV induction Ventilation: Mask ventilation without difficulty LMA: LMA inserted LMA Size: 4.0 Number of attempts: 1 Placement Confirmation: positive ETCO2 and breath sounds checked- equal and bilateral Tube secured with: Tape (taped across cheeks) Dental Injury: Teeth and Oropharynx as per pre-operative assessment

## 2016-05-25 NOTE — Brief Op Note (Addendum)
05/25/2016  9:27 AM  PATIENT:  [name]  50 y.o. female  PRE-OPERATIVE DIAGNOSIS:  sebaceous cyst chest  POST-OPERATIVE DIAGNOSIS:  sebaceous cyst chest  PROCEDURE:  Procedure(s): EXCISION sebaceous cyst (N/A)  SURGEON:  Surgeon(s) and Role:    Loel Lofty Imanii Gosdin, DO - Primary  PHYSICIAN ASSISTANT: Shawn Rayburn, PA  ASSISTANTS: none   ANESTHESIA:   general  EBL:  No intake/output data recorded.  BLOOD ADMINISTERED:none  DRAINS: none   LOCAL MEDICATIONS USED:  LIDOCAINE   SPECIMEN:  Source of Specimen:  chest cyst  DISPOSITION OF SPECIMEN:  path  COUNTS:  YES  TOURNIQUET:  * No tourniquets in log *  DICTATION: .Dragon Dictation  PLAN OF CARE: Discharge to home after PACU  PATIENT DISPOSITION:  PACU - hemodynamically stable.   Delay start of Pharmacological VTE agent (>24hrs) due to surgical blood loss or risk of bleeding: no

## 2016-05-25 NOTE — Op Note (Signed)
DATE OF OPERATION: 05/25/2016  LOCATION: [facility]  PREOPERATIVE DIAGNOSIS: Sebaceous Cyst of chest  POSTOPERATIVE DIAGNOSIS: Same  PROCEDURE: Excision of sebaceous cyst of chest 3 x 3 cm  SURGEON: Claire Sanger Dillingham, DO  ASSISTANT: Shawn Rayburn, PA  EBL: minimal  CONDITION: Stable  COMPLICATIONS: None  INDICATION: The patient, [name], is a 50 y.o. female born on 05/24/1966, is here for treatment of a cyst on her chest that has been getting larger, red and very painful.   PROCEDURE DETAILS:  The patient was seen on the morning of her surgery and marked out for her flap.  The IV antibiotics were given. The patient was taken to the operating room and given a general anesthetic. A standard time out was performed and all information was confirmed by those in the room. SCDs were placed.   The area was marked and local injected for intraoperative hemostasis and postoperative pain control.  The #15 blade was used to make an incision.  The skin hooks were used for retraction.  The scissors and electrocautery were used to excise the entire sac 3 x 3 cm. The area was irrigated with antibiotic solution.  The skin was closed with 4-0 Monocryl sutures. The patient was allowed to wake up and taken to recovery room in stable condition at the end of the case. The family was notified at the end of the case.

## 2016-05-26 ENCOUNTER — Encounter (HOSPITAL_COMMUNITY): Payer: Self-pay | Admitting: Plastic Surgery

## 2016-05-27 ENCOUNTER — Telehealth: Payer: Self-pay | Admitting: Hematology and Oncology

## 2016-05-27 NOTE — Telephone Encounter (Signed)
I called the patient regarding [place] which came back as 50.9. This suggests that she is in the menopausal range. However I informed her that I'm still waiting for the estradiol level. Once that level comes back then we can prescribe her medication. The estradiol level should be less than 9 pg to be in the menopausal range.

## 2016-05-28 ENCOUNTER — Other Ambulatory Visit: Payer: Self-pay

## 2016-05-28 MED ORDER — ANASTROZOLE 1 MG PO TABS: 1.0000 mg | ORAL_TABLET | Freq: Every day | ORAL | 2 refills | Status: DC

## 2016-05-28 NOTE — Progress Notes (Signed)
Per Dr. Geralyn Flash request, pt to start on anastrozole based on her recent lab work revealing menopausal status.  Called pt to confirm pharmacy and let her know this is the medication he has chosen for her.  Explained some of the potential side effects and for pt to contact us should she have any issues or questions.  Pt verbalized understanding and is without concerns at this time.

## 2016-05-30 LAB — AEROBIC/ANAEROBIC CULTURE W GRAM STAIN (SURGICAL/DEEP WOUND)

## 2016-05-30 LAB — AEROBIC/ANAEROBIC CULTURE (SURGICAL/DEEP WOUND)

## 2016-06-09 NOTE — Addendum Note (Signed)
Encounter addended by: Jacqulyn Liner, RN on: 06/09/2016  4:54 PM<BR>    Actions taken: Charge Capture section accepted

## 2016-06-10 ENCOUNTER — Telehealth: Payer: Self-pay | Admitting: Rheumatology

## 2016-06-10 NOTE — Telephone Encounter (Signed)
Pt is getting ready to restart her medication and has some questions. Cb# [phone]

## 2016-06-10 NOTE — Telephone Encounter (Signed)
I have called patient to answer her query. Left message for her to call me back

## 2016-06-10 NOTE — Telephone Encounter (Signed)
Patient returned Amy's phone call. Please call back

## 2016-06-11 NOTE — Telephone Encounter (Signed)
Patient returned Amy's phone call again.

## 2016-06-11 NOTE — Telephone Encounter (Signed)
Ok to start [employer]

## 2016-06-11 NOTE — Telephone Encounter (Signed)
Thank you I have called her to advise. Left message asked her to call me back if she has further questions.

## 2016-06-11 NOTE — Telephone Encounter (Signed)
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 She states she was told to wait to start it. I have explained to her the [place] works differently than the Enbrel and she is fine to start [place]. She wants to know if she needs any testing before she starts the [place], her doctor has told her to ask you about it before she starts. She states she is improving from infection, but her Dr is concerned about recurrence of MRSA

## 2016-06-16 ENCOUNTER — Ambulatory Visit
Admission: RE | Admit: 2016-06-16 | Discharge: 2016-06-16 | Disposition: A | Discharge status: Home / Self Care | Payer: 59 | Source: Ambulatory Visit | Attending: Radiation Oncology | Admitting: Radiation Oncology

## 2016-06-16 ENCOUNTER — Encounter: Payer: Self-pay | Admitting: Radiation Oncology

## 2016-06-16 DIAGNOSIS — Z170 Estrogen receptor positive status [ER+]: Secondary | ICD-10-CM | POA: Diagnosis not present

## 2016-06-16 DIAGNOSIS — C50411 Malignant neoplasm of upper-outer quadrant of right female breast: Secondary | ICD-10-CM

## 2016-06-16 NOTE — Progress Notes (Signed)
Skin status:Right breast with darker pink color than normal. What lotion are you using? Will start using a lotion with vitamin E. Have you seen med onc? If not, when is appointment?  05-18-16 Dr Lindi Adie and next appointment 08-24-16 If they are ER+, have they started Al or Tamoxifen? If not, why? Anastrozole started 05-28-16 Discuss survivorship appointment.08-13-16 Mike Craze, NP Have you had a mammogram scheduled? Not scheduled yet Offer referral to [facility]. Will receive at the survivorship appointment Appetite: Good Pain:None Fatigue:Having mild fatigue. Arm mobility: Able to raise right arm without discomfort. Wt Readings from Last 3 Encounters:  06/16/16 168 lb 9.6 oz (76.5 kg)  05/25/16 171 lb (77.6 kg)  05/20/16 171 lb 12.8 oz (77.9 kg)   BP (!) 124/59   Pulse 77   Temp 98.4 F (36.9 C) (Oral)   Resp 18   Ht 5\' 6"  (1.676 m)   Wt 168 lb 9.6 oz (76.5 kg)   SpO2 100%   BMI 27.21 kg/m

## 2016-06-16 NOTE — Progress Notes (Signed)
Radiation Oncology         [phone] ________________________________  Name: [name] MRN: [id number]  Date: 06/16/2016  DOB: Jan 05, 1966  Follow-Up Visit Note  CC: Shirline Frees, MD  Nicholas Lose, MD    ICD-9-CM ICD-10-CM   1. 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  Patient presents with mild erythema of the right chest. Patient states she will begin to use lotion with Vitamin E. Her next appointment with medical oncology is 08/24/16. She started Anastrozole on 05/28/16. Patient notes she was prescribed an antibiotic for a cyst and had this area surgical removed by her plastic surgeon. Patient notes a good appetite. She notes mild fatigue.  She denies pain and she is able to raise her right arm without discomfort.   Recently started a new psoriasis med.                    ALLERGIES:  is allergic to xanax [alprazolam] and tape.  Meds: Current Outpatient Prescriptions  Medication Sig Dispense Refill  . anastrozole (ARIMIDEX) 1 MG tablet Take 1 tablet (1 mg total) by mouth daily. 30 tablet 2  . Apremilast 10 & 20 & 30 MG TBPK Take 20 mg by mouth 2 (two) times daily. Following dose pack instructions    . LORazepam (ATIVAN) 1 MG tablet Take 1 mg by mouth every 6 (six) hours as needed for anxiety.     . Multiple Vitamins-Minerals (MULTIVITAMIN WITH MINERALS) tablet Take 1 tablet by mouth daily.    . Saccharomyces boulardii (PROBIOTIC) 250 MG CAPS Take 250 mg by mouth daily.     . Wound Dressings (SONAFINE EX) Apply topically.     No current facility-administered medications for this encounter.     Physical Findings: The patient is in no acute distress.  Patient is alert and oriented.  height is '5\' 6"'  (1.676 m) and weight is 168 lb 9.6 oz (76.5 kg). Her oral temperature is 98.4 F (36.9 C). Her blood pressure is 124/59 (abnormal) and her pulse is 77. Her respiration is 18 and oxygen saturation is 100%. .  No significant changes. Lungs are clear to auscultation bilaterally. Heart has regular rate and rhythm. No palpable cervical, supraclavicular, or axillary adenopathy. Abdomen soft, non-tender, normal bowel sounds.  Right chest shows reconstructed breast with dry desquamation but no moist desquamation or skin breakdown. Patient has a scar in the sternum area from recent surgery. This is healing well without signs of infection. She has some erythematous areas around the chest and abdominal region. Patient feels this is likely related to her psoriasis.   Lab Findings: Lab Results  Component Value Date   WBC 5.2 05/25/2016   HGB 12.3 05/25/2016   HCT 37.7 05/25/2016   MCV 93.3 05/25/2016   PLT 125 (L) 05/25/2016    Radiographic Findings: No results found.  Impression:  The patient is recovering from the effects of radiation. She has started Anastrozole. She has also started a new psoriasis medication.  Plan:  Routine follow up in 3 months. She will meet with her plastic surgeon today.    ____________________________________  This document serves as a record of services personally performed by Jeneen Rinks  Davied Nocito, MD. It was created on his behalf by Bethann Humble, a trained medical scribe. The creation of this record is based on the scribe's personal observations and the provider's statements to them. This document has been checked and approved by the attending provider.

## 2016-06-18 ENCOUNTER — Ambulatory Visit: Payer: Self-pay | Admitting: Radiation Oncology

## 2016-06-19 ENCOUNTER — Ambulatory Visit: Payer: Managed Care, Other (non HMO) | Admitting: Hematology and Oncology

## 2016-07-02 ENCOUNTER — Telehealth: Payer: Self-pay | Admitting: Rheumatology

## 2016-07-02 NOTE — Telephone Encounter (Signed)
Patient has questions about [place]. Please call patient.

## 2016-07-03 NOTE — Telephone Encounter (Signed)
Patient has been on [place] for approximately 3 weeks and states she is now experiencing more joint pain than before beginning the [place]. She started Anastrozole about 1.5 months ago for breast cancer as well. She states she can't walk by the end of the day because her knee joint, hip joints and feet. She is asking if drinking Alpha or Aleo water would be beneficial for her.  Please advise

## 2016-07-03 NOTE — Telephone Encounter (Signed)
Can you speak with this patient please

## 2016-07-03 NOTE — Telephone Encounter (Signed)
Called patient to discuss her concerns.  Counseled patient that [place] may take some time to have its full effect and advised her to continue taking the medication.  Reviewed her medications and noted that there are no medication interactions that would cause increased joint pain.  Noted that anastrazole does have the common adverse effect (>10%) of arthritis, pain, arthralgia.  Advised patient to discuss this with her oncologist.    Patient also asked about the benefits of Aloe water or alpha water?  Advised patient that I am not familiar with the use of either of these for joint pain.  Reviewed natural anti-inflammatories such as turmeric, ginger, omega 3, and tart cherry.   Patient denied any other medication-related questions.   Elisabeth Most, Pharm.D., BCPS Clinical Pharmacist Pager: [phone] Phone: [phone] 07/03/2016 4:23 PM

## 2016-07-06 ENCOUNTER — Telehealth: Payer: Self-pay

## 2016-07-06 NOTE — Telephone Encounter (Addendum)
Called pt back regarding her side effects with arimidex. Pt states that she has been on arimidex for 1.2mo and also started psoriatic arthritis medication at the same time for severe joint pain. Pt states that her joints are severely painful and called her rheumatology dr. She was told that her psoriatic arthritis medication shouldn't cause that much joint pain s/e. They had referred her back to her oncologist to question side effects of arimidex. Pt states that her bilateral extremities are so painful and hot from the join inflammation. Pt stated that she experienced headaches, nausea in the beginning and has been resolved after a few weeks of arimidex. Pt  would like to know what she needs to do. Told pt that she can try taking the medication at night and see if that helps the joint pain or she can stop taking the medication for 1-2 weeks and see if this helps reduce her pain. Pt concerned that her cancer would come back. Assured pt that her cancer will not come back in the next 2 weeks but will certainly have her follow up with Dr. Lindi Adie to discuss other options regarding her medication.Pt verbalized understanding. Told pt to give [place] update in 1 week to see how she is doing without the arimidex. No further concerns at this time.

## 2016-07-13 ENCOUNTER — Ambulatory Visit: Payer: 59 | Attending: Plastic Surgery | Admitting: Physical Therapy

## 2016-07-13 DIAGNOSIS — M25611 Stiffness of right shoulder, not elsewhere classified: Secondary | ICD-10-CM | POA: Insufficient documentation

## 2016-07-13 DIAGNOSIS — M25511 Pain in right shoulder: Secondary | ICD-10-CM | POA: Insufficient documentation

## 2016-07-13 NOTE — Therapy (Signed)
[place], [place], [address] Phone: [phone]   Fax:  [phone]  Physical Therapy Evaluation  Patient Details  Name: [name] MRN: [id number] Date of Birth: 03-Sep-1965 Referring Provider: Dr. Audelia Hives  Encounter Date: 07/13/2016      PT End of Session - 07/13/16 1506    Visit Number 1   Number of Visits 9   Date for PT Re-Evaluation 08/16/16   PT Start Time 1350   PT Stop Time 1437   PT Time Calculation (min) 47 min   Activity Tolerance Patient tolerated treatment well   Behavior During Therapy [facility] for tasks assessed/performed      Past Medical History:  Diagnosis Date  . Anxiety   . Breast cancer ([place])   . History of radiation therapy 04/01/16-05/13/16   right chest wall 45 Gy, right chest wall boost 10 Gy  . HTN (hypertension)    no meds now  . Psoriatic arthritis ([place])   . Sebaceous cyst    chest    Past Surgical History:  Procedure Laterality Date  . BREAST RECONSTRUCTION WITH PLACEMENT OF TISSUE EXPANDER AND FLEX HD (ACELLULAR HYDRATED DERMIS) Right 01/09/2016   Procedure: BREAST RECONSTRUCTION WITH PLACEMENT OF TISSUE EXPANDER AND FLEX HD (ACELLULAR HYDRATED DERMIS);  Surgeon: Loel Lofty Dillingham, DO;  Location: [place];  Service: Plastics;  Laterality: Right;  . MASS EXCISION N/A 05/25/2016   Procedure: EXCISION sebaceous cyst;  Surgeon: Loel Lofty Dillingham, DO;  Location: [place];  Service: Plastics;  Laterality: N/A;  . MASTOPEXY Left 02/27/2016   Procedure: LEFT BREAST MASTOPEXY;  Surgeon: Wallace Going, DO;  Location: [place];  Service: Plastics;  Laterality: Left;  . REMOVAL OF TISSUE EXPANDER AND PLACEMENT OF IMPLANT Right 02/27/2016   Procedure: REMOVAL OF RIGHT  TISSUE EXPANDERS WITH PLACEMENT OF RIGHT  BREAST IMPLANTS ;  Surgeon: Wallace Going, DO;  Location: [place];  Service: Plastics;  Laterality: Right;  . SIMPLE  MASTECTOMY WITH AXILLARY SENTINEL NODE BIOPSY Right 01/09/2016   Procedure: INJECTION BLUE DYE RIGHT BREAST,  TOTAL MASTECTOMY WITH  RIGHT AXILLARY SENTINEL NODE BIOPSY;  Surgeon: Fanny Skates, MD;  Location: [place];  Service: General;  Laterality: Right;  . TONSILLECTOMY    . TOTAL ABDOMINAL HYSTERECTOMY      There were no vitals filed for this visit.       Subjective Assessment - 07/13/16 1352    Subjective Had breast cancer surgery, then reconstruction; now breast feels hard as a rock and everything like a laugh or sneeze makes it pull.   Pertinent History Right breast cancer diagnosed 11/09/15; surgery 01/09/16 (mastectomy with three lymph nodes removed and immediate reconstruction with expander placement); implant 02/27/16; had 33 radiation treatments, completed around 05/07/16.  Currently on anastrazole.  Had a sebaceous cyst at right chest just lateral to sternum; this was excised and then later drained of a bloody fluid.  She also reports an infection in the breast after her second surgery.  She was treated with antibiotics for each.  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  Psoriatic arthritis and psoriasis.  Currently has severe joint pain all over; also gets skin patches.  Just atarted a new medication after having to go off her old psoriatic arthritis medication.  She isn't sure if current joint pain is from arthritis or arimidex.   Patient Stated Goals Improve ROM and get less tightness.   Currently  in Pain? Yes   Pain Score 0-No pain  6 to 8 or 9   Pain Location Chest  and axilla   Pain Orientation Right   Pain Descriptors / Indicators Tightness  hardness   Pain Onset More than a month ago   Aggravating Factors  cough, sneeze, move, turn in bed   Pain Relieving Factors rest   Multiple Pain Sites Yes   Pain Score 6  up to 8 or 9   Pain Location --  all joints   Pain Descriptors / Indicators Other (Comment)  stiffness, heat and hurt  to the touch   Aggravating Factors  pressure on some areas, walking   Pain Relieving Factors rest            [facility] PT Assessment - 07/13/16 0001      Assessment   Medical Diagnosis right breast cancer s/p reconstruction with implant   Referring Provider Dr. Lyndee Leo Dillingham   Onset Date/Surgical Date 02/27/16     Precautions   Precautions Other (comment)   Precaution Comments cancer precautions     Restrictions   Weight Bearing Restrictions No     Balance Screen   Has the patient fallen in the past 6 months Yes   How many times? 1  caught flip flop on carpet   Has the patient had a decrease in activity level because of a fear of falling?  Yes   Is the patient reluctant to leave their home because of a fear of falling?  No     Home Ecologist residence   Living Arrangements Spouse/significant other;Children   Type of [place] Layout Multi-level   Alternate Level Stairs-Rails Left     Prior Function   Level of Independence Independent   Vocation Unemployed   Leisure no regular exercise; has an outdoor pool at home and tried to do walking in the pool this summer     Cognition   Overall Cognitive Status Within Functional Limits for tasks assessed     Observation/Other Assessments   Observations right breast incisions well-healed; left breast also has scars inferiorly and around nipple from lift surgery (at the same time as second/implant surgery); has dogear still visible on right   Quick DASH  54.55     ROM / Strength   AROM / PROM / Strength AROM     AROM   AROM Assessment Site Shoulder   Right/Left Shoulder Right;Left   Right Shoulder Flexion 133 Degrees   Right Shoulder ABduction 106 Degrees   Right Shoulder Internal Rotation --  WFL   Right Shoulder External Rotation 76 Degrees  in sitting   Right Shoulder Horizontal ABduction 30 Degrees   Left Shoulder Flexion 161 Degrees   Left Shoulder ABduction 180 Degrees    Left Shoulder Internal Rotation --  [facility]   Left Shoulder External Rotation 72 Degrees  in sitting   Left Shoulder Horizontal ABduction 53 Degrees     Palpation   Palpation comment right breast implant area does feel very firm today     Ambulation/Gait   Ambulation/Gait Yes   Ambulation/Gait Assistance 6: Modified independent (Device/Increase time)   Ambulation Distance (Feet) 20 Feet   Gait Comments slightly stiff in appearance; she reports she gets stiffer and less comfortable with longer distances              Quick Dash - 07/13/16 0001    Open a tight or new jar  Mild difficulty   Do heavy household chores (wash walls, wash floors) Moderate difficulty   Carry a shopping bag or briefcase Moderate difficulty   Wash your back Moderate difficulty   Use a knife to cut food Mild difficulty   Recreational activities in which you take some force or impact through your arm, shoulder, or hand (golf, hammering, tennis) Severe difficulty   During the past week, to what extent has your arm, shoulder or hand problem interfered with your normal social activities with family, friends, neighbors, or groups? Quite a bit   During the past week, to what extent has your arm, shoulder or hand problem limited your work or other regular daily activities Quite a bit   Arm, shoulder, or hand pain. Moderate   Tingling (pins and needles) in your arm, shoulder, or hand Moderate   Difficulty Sleeping Severe difficulty   DASH Score 54.55 %                             Long Term Clinic Goals - 07/13/16 2028      CC Long Term Goal  #1   Title Pt. will be independent in HEP for right shoulder ROM   Time 4   Period Weeks   Status New     CC Long Term Goal  #2   Title Right shoulder flexion to at least 150 degrees actively for improved overhead reach   Baseline 133 at eval compared to 161 on left   Time 4   Period Weeks   Status New     CC Long Term Goal  #3   Title right  shoulder active abduction to at least 160 degrees for improved ADLs   Baseline 106 compared to 180 left   Time 4   Period Weeks   Status New     CC Long Term Goal  #4   Title right shoulder area feeling of tightness decreased by at least 50%   Time 4   Period Weeks   Status New     CC Long Term Goal  #5   Title Quick DASH score reduced to 30 or less   Baseline 55 at eval   Time 4   Period Weeks   Status New            Plan - 07/13/16 1507    Clinical Impression Statement Patient who is s/p mastectectomy with implant reconstruction and radiation, now with limited right shoulder ROM and significant feelings of tightness in the area.  She had complications including a cyst and then aspiration of bloody discharge near sternum plus antibiotic treatment twice for infection.  Eval is moderate complexity due to psoriatic arthritis comorbidity and evolving with ccontinuing changes from radiation treatment.   Rehab Potential Good   Clinical Impairments Affecting Rehab Potential prior radiation over implant   PT Frequency 2x / week   PT Duration 4 weeks   PT Treatment/Interventions ADLs/Self Care Home Management;Electrical Stimulation;Therapeutic exercise;Patient/family education;Manual techniques;Scar mobilization;Passive range of motion   PT Next Visit Plan Begin P/AA/A/ROM and myofascial release; initial HEP for right shoulder ROM.   Consulted and Agree with Plan of Care Patient      Patient will benefit from skilled therapeutic intervention in order to improve the following deficits and impairments:  Decreased range of motion, Impaired flexibility, Impaired UE functional use, Pain  Visit Diagnosis: Stiffness of right shoulder, not elsewhere classified - Plan: PT plan of  care cert/re-cert  Right shoulder pain, unspecified chronicity - Plan: PT plan of care cert/re-cert     Problem List Patient Active Problem List   Diagnosis Date Noted  . Breast cancer of upper-outer  quadrant of right female breast ([place]) 11/08/2015  . 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 Cough 12/12/2012    Woodward Klem 07/13/2016, 8:45 PM  [place], [place], [address] Phone: [phone]   Fax:  [phone]  Name: [name] MRN: [id number] Date of Birth: 1966-03-03

## 2016-07-14 ENCOUNTER — Telehealth: Payer: Self-pay | Admitting: Rheumatology

## 2016-07-14 ENCOUNTER — Telehealth: Payer: Self-pay | Admitting: Radiology

## 2016-07-14 MED ORDER — APREMILAST 30 MG PO TABS: 1.0000 | ORAL_TABLET | Freq: Two times a day (BID) | ORAL | 2 refills | Status: DC

## 2016-07-14 NOTE — Telephone Encounter (Signed)
Patient is requesting refill of Rutherford Nail be sent to [place]. Patient provided a phone number for Christella Scheuermann of [phone] in case it was needed.

## 2016-07-14 NOTE — Telephone Encounter (Signed)
Last visit 05/07/16 Message sent to make 67mo follow up visit  Ok to refill per Dr Estanislado Pandy

## 2016-07-15 NOTE — Telephone Encounter (Signed)
This was a refill encounter only, pt requested Rutherford Nail, ok to refill per Dr Estanislado Pandy

## 2016-07-16 ENCOUNTER — Telehealth: Payer: Self-pay | Admitting: Rheumatology

## 2016-07-16 NOTE — Telephone Encounter (Signed)
-----   Message from Candice Camp, RT sent at 07/14/2016 10:28 AM EST ----- Was here in Sept, needs 6 months follow up

## 2016-07-16 NOTE — Telephone Encounter (Signed)
Called patient on 07/15/16 and left message for patient to call back to schedule appointment.

## 2016-07-20 ENCOUNTER — Telehealth: Payer: Self-pay

## 2016-07-20 ENCOUNTER — Ambulatory Visit: Payer: Managed Care, Other (non HMO) | Attending: Plastic Surgery

## 2016-07-20 DIAGNOSIS — M25611 Stiffness of right shoulder, not elsewhere classified: Secondary | ICD-10-CM | POA: Diagnosis not present

## 2016-07-20 DIAGNOSIS — M25511 Pain in right shoulder: Secondary | ICD-10-CM | POA: Diagnosis present

## 2016-07-20 DIAGNOSIS — R293 Abnormal posture: Secondary | ICD-10-CM | POA: Diagnosis present

## 2016-07-20 NOTE — Patient Instructions (Addendum)
Cancer Rehab [phone] SHOULDER: External Rotation - Supine (Cane)   Hold cane with both hands. Rotate arm away from body. Keep elbow on floor and next to body. Hold for 5 seconds, _10__ reps per set, _2-3__ sets per day. Add towel to keep elbow at side.  Copyright  VHI. All rights reserved.  Cane Horizontal - Supine   With straight arms holding cane above shoulders, bring cane out to right, center, out to left, and back to above head. Hold 5 seconds. Repeat _10__ times. Do _2-3__ times per day.  Copyright  VHI. All rights reserved.  Cane Exercise: Flexion   Lie on back, holding cane above chest. Keeping arms as straight as possible, lower cane toward floor beyond head. Hold __5__ seconds. Repeat __10__ times. Do _2-3___ sessions per day.  http://gt2.exer.us/91   Copyright  VHI. All rights reserved.

## 2016-07-20 NOTE — Telephone Encounter (Signed)
Returned pt call regarding side effects from anastrozole. Pt states that she did not stop taking it since her last conversation with nurse, and just took it at different time. Told pt to stop taking her anastrozole now and wait for further instructions when she sees Dr. Lindi Adie on Wed. 12/6. Pt voiced understanding and will look forward to seeing Dr.Gudena this week.No further questions at this time.

## 2016-07-20 NOTE — Therapy (Signed)
[place], [place], [address] Phone: [phone]   Fax:  [phone]  Physical Therapy Treatment  Patient Details  Name: [name] MRN: [id number] Date of Birth: 1966/01/18 Referring Provider: Dr. Audelia Hives  Encounter Date: 07/20/2016      PT End of Session - 07/20/16 0846    Visit Number 2   Number of Visits 9   Date for PT Re-Evaluation 08/16/16   PT Start Time 0806  Therapist running late due to traffic   PT Stop Time 0915   PT Time Calculation (min) 69 min   Activity Tolerance Patient tolerated treatment well   Behavior During Therapy [facility] for tasks assessed/performed      Past Medical History:  Diagnosis Date  . Anxiety   . Breast cancer ([place])   . History of radiation therapy 04/01/16-05/13/16   right chest wall 45 Gy, right chest wall boost 10 Gy  . HTN (hypertension)    no meds now  . Psoriatic arthritis ([place])   . Sebaceous cyst    chest    Past Surgical History:  Procedure Laterality Date  . BREAST RECONSTRUCTION WITH PLACEMENT OF TISSUE EXPANDER AND FLEX HD (ACELLULAR HYDRATED DERMIS) Right 01/09/2016   Procedure: BREAST RECONSTRUCTION WITH PLACEMENT OF TISSUE EXPANDER AND FLEX HD (ACELLULAR HYDRATED DERMIS);  Surgeon: Loel Lofty Dillingham, DO;  Location: [place];  Service: Plastics;  Laterality: Right;  . MASS EXCISION N/A 05/25/2016   Procedure: EXCISION sebaceous cyst;  Surgeon: Loel Lofty Dillingham, DO;  Location: [place];  Service: Plastics;  Laterality: N/A;  . MASTOPEXY Left 02/27/2016   Procedure: LEFT BREAST MASTOPEXY;  Surgeon: Wallace Going, DO;  Location: [place];  Service: Plastics;  Laterality: Left;  . REMOVAL OF TISSUE EXPANDER AND PLACEMENT OF IMPLANT Right 02/27/2016   Procedure: REMOVAL OF RIGHT  TISSUE EXPANDERS WITH PLACEMENT OF RIGHT  BREAST IMPLANTS ;  Surgeon: Wallace Going, DO;  Location: [place];  Service:  Plastics;  Laterality: Right;  . SIMPLE MASTECTOMY WITH AXILLARY SENTINEL NODE BIOPSY Right 01/09/2016   Procedure: INJECTION BLUE DYE RIGHT BREAST,  TOTAL MASTECTOMY WITH  RIGHT AXILLARY SENTINEL NODE BIOPSY;  Surgeon: Fanny Skates, MD;  Location: [place];  Service: General;  Laterality: Right;  . TONSILLECTOMY    . TOTAL ABDOMINAL HYSTERECTOMY      There were no vitals filed for this visit.      Subjective Assessment - 07/20/16 0809    Subjective My Rt hip has started bothering over past 2-4 weeks probably due to starting anastrozole but this monring I woke up and the pain was really sharp and worse than it's been. I have an appt with Dr. Ladora Daniel Wednesday and I will address all this with him then.    Pertinent History Right breast cancer diagnosed 11/09/15; surgery 01/09/16 (mastectomy with three lymph nodes removed and immediate reconstruction with expander placement); implant 02/27/16; had 33 radiation treatments, completed around 05/07/16.  Currently on anastrazole.  Had a sebaceous cyst at right chest just lateral to sternum; this was excised and then later drained of a bloody fluid.  She also reports an infection in the breast after her second surgery.  She was treated with antibiotics for each.  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  Psoriatic arthritis and psoriasis.  Currently has severe joint pain all over; also gets skin patches.  Just atarted a new medication after having to go  off her old psoriatic arthritis medication.  She isn't sure if current joint pain is from arthritis or arimidex.   Patient Stated Goals Improve ROM and get less tightness.   Currently in Pain? Yes   Pain Score 10-Worst pain ever   Pain Location Hip   Pain Orientation Right   Pain Descriptors / Indicators Sharp   Pain Type Other (Comment)  Thinks its from new medication which causes joint pain   Aggravating Factors  walking or turn wrong   Pain Relieving Factors  just got worse this morning                         [facility] Adult PT Treatment/Exercise - 07/20/16 0001      Shoulder Exercises: Supine   Horizontal ABduction AAROM;Right;5 reps  Using dowel rod, holding 5 seconds   External Rotation AAROM;Right;5 reps  Using dowel rod, holding 5 seconds   Flexion AAROM;Right;10 reps  Using dowel rod, holding 5 seconds     Shoulder Exercises: Pulleys   Flexion 2 minutes   Flexion Limitations Pt returned therapist demo   ABduction 2 minutes   ABduction Limitations VC to decrease scapular compensation throughout     Moist Heat Therapy   Number Minutes Moist Heat 10 Minutes   Moist Heat Location Hip  Rt during session to try to calm down pts max pain      Manual Therapy   Passive ROM In Supine: to Rt shoulder into flexion, abduction, er all to pts tolerance                PT Education - 07/20/16 0845    Education provided Yes   Education Details Supine AA/ROM exercises with dowel rod   Person(s) Educated Patient   Methods Explanation;Demonstration;Handout   Comprehension Verbalized understanding;Returned demonstration                [facility] [facility] Goals - 07/20/16 [phone]      CC Long Term Goal  #1   Title Pt. will be independent in HEP for right shoulder ROM   Baseline Issued intial HEP today-07/20/16   Status On-going     CC Long Term Goal  #2   Title Right shoulder flexion to at least 150 degrees actively for improved overhead reach   Status On-going     CC Long Term Goal  #3   Title right shoulder active abduction to at least 160 degrees for improved ADLs   Status On-going     CC Long Term Goal  #4   Title right shoulder area feeling of tightness decreased by at least 50%   Status On-going     CC Long Term Goal  #5   Title Quick DASH score reduced to 30 or less   Status On-going            Plan - 07/20/16 0913    Clinical Impression Statement Pt did well with first session of stretching  though she did have max amount of Rt hip pain this morning causing a very antalgic gait. She reports it has been hurting her causing a slight limp over past 2-4 weeks but this morning it's the worse it's ever been. So allowed pt to try, in sitting, some heat to her Rt hip after session for about 10 mins which she reported not really noticing if it helped. Suggested she update doctor if it continues if she doesn't think she can wait until her  Wednesday appt. Also suggested that if Dr. Lindi Adie thinks PT could help her Rt hip pain to send [place] an order to add to her POC. She did well with instruction of HEP and reported no increased pain or discomfort in Rt shoulder at end of session.    Rehab Potential Good   Clinical Impairments Affecting Rehab Potential prior radiation over implant   PT Frequency 2x / week   PT Duration 4 weeks   PT Treatment/Interventions ADLs/Self Care Home Management;Electrical Stimulation;Therapeutic exercise;Patient/family education;Manual techniques;Scar mobilization;Passive range of motion   PT Next Visit Plan Cont P/AA/A/ROM and begin myofascial release to Rt axilla/chest; review HEP for right shoulder ROM and progress prn. If Dr. Lindi Adie sends order for Rt hip pain, PT to add this to POC, and possibly IFC??   Consulted and Agree with Plan of Care Patient      Patient will benefit from skilled therapeutic intervention in order to improve the following deficits and impairments:  Decreased range of motion, Impaired flexibility, Impaired UE functional use, Pain  Visit Diagnosis: Stiffness of right shoulder, not elsewhere classified  Right shoulder pain, unspecified chronicity  Abnormal posture     Problem List Patient Active Problem List   Diagnosis Date Noted  . Breast cancer of upper-outer quadrant of right female breast ([place]) 11/08/2015  . 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 Cough 12/12/2012    Otelia Limes, PTA 07/20/2016, 9:29 AM  [place], [place], [address] Phone: [phone]   Fax:  [phone]  Name: [name] MRN: [id number] Date of Birth: [birth year]

## 2016-07-21 NOTE — Assessment & Plan Note (Signed)
Right mastectomy 01/09/2016: IDC, 2 tumors, 3.2 cm and 2.2 cm, 2/3 sentinel nodes positive, focal extranodal extension, 2 additional nodes negative, ER 100%, PR 90%, HER-2 negative ratio 1.16, Ki-67 10% and 7%, T2(multifocal) N1 a stage IIB Mammaprint: Luminal type A , low risk Adjuvant radiation therapy 04/01/2016 to 05/12/2016   Current Treatment: Anastrozole started 05/18/16  Anastrozole Toxicities:  [place] Counseling:

## 2016-07-22 ENCOUNTER — Encounter: Payer: Self-pay | Admitting: Hematology and Oncology

## 2016-07-22 ENCOUNTER — Other Ambulatory Visit: Payer: Self-pay

## 2016-07-22 ENCOUNTER — Ambulatory Visit (HOSPITAL_BASED_OUTPATIENT_CLINIC_OR_DEPARTMENT_OTHER): Payer: Managed Care, Other (non HMO) | Admitting: Hematology and Oncology

## 2016-07-22 VITALS — BP 173/58 | HR 77 | Temp 97.8°F | Resp 18 | Wt 165.0 lb

## 2016-07-22 DIAGNOSIS — Z170 Estrogen receptor positive status [ER+]: Secondary | ICD-10-CM | POA: Diagnosis not present

## 2016-07-22 DIAGNOSIS — Z79811 Long term (current) use of aromatase inhibitors: Secondary | ICD-10-CM

## 2016-07-22 DIAGNOSIS — C50411 Malignant neoplasm of upper-outer quadrant of right female breast: Secondary | ICD-10-CM

## 2016-07-22 DIAGNOSIS — M791 Myalgia: Secondary | ICD-10-CM | POA: Diagnosis not present

## 2016-07-22 DIAGNOSIS — Z780 Asymptomatic menopausal state: Secondary | ICD-10-CM

## 2016-07-22 MED ORDER — LETROZOLE 2.5 MG PO TABS: 2.5000 mg | ORAL_TABLET | Freq: Every day | ORAL | 0 refills | Status: DC

## 2016-07-22 NOTE — Progress Notes (Signed)
Called pt back returning her vm regarding question on upcoming mm, [place] and bone density if its diagnostics or preventative. Told pt it will be for diagnostic testing. Scheduled pt for next week mm and [place] 12/13 and 12/18 for bone density test. Gave pt breast center number in case of any schedule issues. Pt appreciative and has no further question at this time.

## 2016-07-22 NOTE — Progress Notes (Signed)
Patient Care Team: Shirline Frees, MD as PCP - General (Family Medicine) Bo Merino, MD as Referring Physician (Rheumatology) Sylvan Cheese, NP as Nurse Practitioner (Hematology and Oncology)  DIAGNOSIS:  Encounter Diagnosis  Name Primary?  . Malignant neoplasm of upper-outer quadrant of right breast in female, estrogen receptor positive ([place])     SUMMARY OF ONCOLOGIC HISTORY:   Breast cancer of upper-outer quadrant of right female breast ([place])   11/07/2015 Initial Diagnosis    Right breast biopsy 12:30 position: Invasive ductal carcinoma grade 2, ER/PR positive, HER-2 negative ratio 1.17, Ki-67 7%, ultrasound revealed 2.7 cm mass, T2 N0 stage II a clinical stage      01/09/2016 Surgery    Right mastectomy: IDC, 2 tumors, 3.2 cm and 2.2 cm, 2/3 sentinel nodes positive, focal extranodal extension, 2 additional nodes negative, ER 100%, PR 90%, HER-2 negative ratio 1.16, Ki-67 10% and 7%, T2(multifocal) N1 a stage IIB      01/09/2016 Miscellaneous    Mammaprint: Luminal type A, low risk (did not need chemotherapy)      04/01/2016 - 05/12/2016 Radiation Therapy    Adjuvant radiation therapy      05/18/2016 -  Anti-estrogen oral therapy    Anastrozole 1 mg daily       CHIEF COMPLIANT: Follow-up on anastrozole  INTERVAL HISTORY: [name] is a 50 year old with above-mentioned history of right breast cancer treated with mastectomy and is received radiation. She is currently on oral antiestrogen therapy with anastrozole. She is having profound musculoskeletal aches and pains and made her quality of life extremely poor. She is also having lots of pain and discomfort in the reconstructed right breast. She has been slightly paranoid about recurrence of breast cancer and feels every change in the breast as recurrence. She also feels that all the body aches and pains could be metastatic disease in the bones.Marland Kitchen  REVIEW OF SYSTEMS:   Constitutional: Denies fevers, chills or  abnormal weight loss Eyes: Denies blurriness of vision Ears, nose, mouth, throat, and face: Denies mucositis or sore throat Respiratory: Denies cough, dyspnea or wheezes Cardiovascular: Denies palpitation, chest discomfort Gastrointestinal:  Denies nausea, heartburn or change in bowel habits Skin: Denies abnormal skin rashes Lymphatics: Denies new lymphadenopathy or easy bruising Neurological:Denies numbness, tingling or new weaknesses Behavioral/Psych: Mood is stable, no new changes  Extremities: No lower extremity edema Breast:  Pain in the right breast that has been reconstructed. All other systems were reviewed with the patient and are negative.  I have reviewed the past medical history, past surgical history, social history and family history with the patient and they are unchanged from previous note.  ALLERGIES:  is allergic to xanax [alprazolam] and tape.  MEDICATIONS:  Current Outpatient Prescriptions  Medication Sig Dispense Refill  . anastrozole (ARIMIDEX) 1 MG tablet Take 1 tablet (1 mg total) by mouth daily. 30 tablet 2  . Apremilast 30 MG TABS Take 1 tablet by mouth 2 (two) times daily. 60 tablet 2  . Ginger, Zingiber officinalis, (GINGER EXTRACT PO) Take by mouth.    Marland Kitchen LORazepam (ATIVAN) 1 MG tablet Take 1 mg by mouth every 6 (six) hours as needed for anxiety.     . Multiple Vitamins-Minerals (MULTIVITAMIN WITH MINERALS) tablet Take 1 tablet by mouth daily.    . Omega-3 Fatty Acids (FISH OIL PO) Take by mouth.    . Saccharomyces boulardii (PROBIOTIC) 250 MG CAPS Take 250 mg by mouth daily.     . TURMERIC PO Take by mouth.    Marland Kitchen  Wound Dressings (SONAFINE EX) Apply topically.     No current facility-administered medications for this visit.     PHYSICAL EXAMINATION: ECOG PERFORMANCE STATUS: 1 - Symptomatic but completely ambulatory  Vitals:   07/22/16 0911  BP: (!) 173/58  Pulse: 77  Resp: 18  Temp: 97.8 F (36.6 C)   Filed Weights   07/22/16 0911  Weight: 165  lb (74.8 kg)    GENERAL:alert, no distress and comfortable SKIN: skin color, texture, turgor are normal, no rashes or significant lesions EYES: normal, Conjunctiva are pink and non-injected, sclera clear OROPHARYNX:no exudate, no erythema and lips, buccal mucosa, and tongue normal  NECK: supple, thyroid normal size, non-tender, without nodularity LYMPH:  no palpable lymphadenopathy in the cervical, axillary or inguinal LUNGS: clear to auscultation and percussion with normal breathing effort HEART: regular rate & rhythm and no murmurs and no lower extremity edema ABDOMEN:abdomen soft, non-tender and normal bowel sounds MUSCULOSKELETAL:no cyanosis of digits and no clubbing  NEURO: alert & oriented x 3 with fluent speech, no focal motor/sensory deficits EXTREMITIES: No lower extremity edema Breast: Right breast reconstructed. Left breast no palpable lumps or nodules of concern.  LABORATORY DATA:  I have reviewed the data as listed   Chemistry      Component Value Date/Time   NA 139 05/25/2016 0716   NA 138 11/21/2015 1433   K 3.8 05/25/2016 0716   K 3.9 11/21/2015 1433   CL 108 05/25/2016 0716   CO2 26 05/25/2016 0716   CO2 26 11/21/2015 1433   BUN 8 05/25/2016 0716   BUN 9.9 11/21/2015 1433   CREATININE 0.54 05/25/2016 0716   CREATININE 0.7 11/21/2015 1433      Component Value Date/Time   CALCIUM 9.0 05/25/2016 0716   CALCIUM 9.3 11/21/2015 1433   ALKPHOS 83 01/02/2016 1025   ALKPHOS 95 11/21/2015 1433   AST 67 (H) 01/02/2016 1025   AST 114 (H) 11/21/2015 1433   ALT 46 01/02/2016 1025   ALT 68 (H) 11/21/2015 1433   BILITOT 1.2 01/02/2016 1025   BILITOT 1.47 (H) 11/21/2015 1433       Lab Results  Component Value Date   WBC 5.2 05/25/2016   HGB 12.3 05/25/2016   HCT 37.7 05/25/2016   MCV 93.3 05/25/2016   PLT 125 (L) 05/25/2016   NEUTROABS 3.8 01/02/2016    ASSESSMENT & PLAN:  Breast cancer of upper-outer quadrant of right female breast ([place]) Right mastectomy  01/09/2016: IDC, 2 tumors, 3.2 cm and 2.2 cm, 2/3 sentinel nodes positive, focal extranodal extension, 2 additional nodes negative, ER 100%, PR 90%, HER-2 negative ratio 1.16, Ki-67 10% and 7%, T2(multifocal) N1 a stage IIB Mammaprint: Luminal type A , low risk Adjuvant radiation therapy 04/01/2016 to 05/12/2016   Current Treatment: Anastrozole started 05/18/16 Discontinued 07/22/2016  Anastrozole Toxicities: Severe musculoskeletal aches and pains: I recommended discontinuation of anastrozole therapy. I will switch her to letrozole. I instructed her to start January 1 with half a tablet of letrozole daily. If she tolerates it well then she can increase in February to 1 full tablet daily.    PALLAS Counseling: At this time because she is having profound side effects to antiestrogen therapy, I did not recommend PALLAS clinical trial  return to clinic February 1 week to assess tolerability to letrozole.   bone density as well as mammogram on the left breast has been ordered   No orders of the defined types were placed in this encounter.  The patient has a  good understanding of the overall plan. she agrees with it. she will call with any problems that may develop before the next visit here.   Rulon Eisenmenger, MD 07/22/16

## 2016-07-24 ENCOUNTER — Ambulatory Visit: Payer: Managed Care, Other (non HMO) | Admitting: Physical Therapy

## 2016-07-24 DIAGNOSIS — M25611 Stiffness of right shoulder, not elsewhere classified: Secondary | ICD-10-CM

## 2016-07-24 DIAGNOSIS — M25511 Pain in right shoulder: Secondary | ICD-10-CM

## 2016-07-24 DIAGNOSIS — R293 Abnormal posture: Secondary | ICD-10-CM

## 2016-07-24 NOTE — Therapy (Signed)
[place], [place], [address] Phone: [phone]   Fax:  [phone]  Physical Therapy Treatment  Patient Details  Name: Roxann Massaro MRN: [id number] Date of Birth: Jan 02, 1966 Referring Provider: Dr. Audelia Hives  Encounter Date: 07/24/2016      PT End of Session - 07/24/16 1138    Visit Number 3   Number of Visits 9   Date for PT Re-Evaluation 08/16/16   PT Start Time 0801   PT Stop Time 0850   PT Time Calculation (min) 49 min   Activity Tolerance Patient tolerated treatment well   Behavior During Therapy [facility] for tasks assessed/performed      Past Medical History:  Diagnosis Date  . Anxiety   . Breast cancer ([place])   . History of radiation therapy 04/01/16-05/13/16   right chest wall 45 Gy, right chest wall boost 10 Gy  . HTN (hypertension)    no meds now  . Psoriatic arthritis ([place])   . Sebaceous cyst    chest    Past Surgical History:  Procedure Laterality Date  . BREAST RECONSTRUCTION WITH PLACEMENT OF TISSUE EXPANDER AND FLEX HD (ACELLULAR HYDRATED DERMIS) Right 01/09/2016   Procedure: BREAST RECONSTRUCTION WITH PLACEMENT OF TISSUE EXPANDER AND FLEX HD (ACELLULAR HYDRATED DERMIS);  Surgeon: Loel Lofty Dillingham, DO;  Location: [place];  Service: Plastics;  Laterality: Right;  . MASS EXCISION N/A 05/25/2016   Procedure: EXCISION sebaceous cyst;  Surgeon: Loel Lofty Dillingham, DO;  Location: [place];  Service: Plastics;  Laterality: N/A;  . MASTOPEXY Left 02/27/2016   Procedure: LEFT BREAST MASTOPEXY;  Surgeon: Wallace Going, DO;  Location: [place];  Service: Plastics;  Laterality: Left;  . REMOVAL OF TISSUE EXPANDER AND PLACEMENT OF IMPLANT Right 02/27/2016   Procedure: REMOVAL OF RIGHT  TISSUE EXPANDERS WITH PLACEMENT OF RIGHT  BREAST IMPLANTS ;  Surgeon: Wallace Going, DO;  Location: [facility];  Service: Plastics;  Laterality: Right;  . SIMPLE  MASTECTOMY WITH AXILLARY SENTINEL NODE BIOPSY Right 01/09/2016   Procedure: INJECTION BLUE DYE RIGHT BREAST,  TOTAL MASTECTOMY WITH  RIGHT AXILLARY SENTINEL NODE BIOPSY;  Surgeon: Fanny Skates, MD;  Location: [facility];  Service: General;  Laterality: Right;  . TONSILLECTOMY    . TOTAL ABDOMINAL HYSTERECTOMY      There were no vitals filed for this visit.      Subjective Assessment - 07/24/16 0803    Subjective Dr. Lindi Adie took me off the anastrozole until January 1st and then we will try something new.  Mammogram and [place] ordered for left breast; also bone density scan for baseline measurement.  Also saw Dr. Dalbert Batman (surgeon) this week.  He thinks I look good; unfortunately I don't feel good.  Maybe had a little tightness tightness or soreness after last visit here, but nothing unmanageable.  Has done dowel exercises some, but not as much as she should.  Hip spasms have decreased in number since earlier this week.   Currently in Pain? Yes   Pain Score 6    Pain Location Head  jaw and neck too   Pain Descriptors / Indicators Other (Comment)  enough to be annoying   Aggravating Factors  doesn't sleep well   Pain Relieving Factors no idea                         [facility] Adult PT Treatment/Exercise - 07/24/16 0001  Shoulder Exercises: Supine   Horizontal ABduction AAROM;Right;Left;5 reps  Using dowel rod, holding 5 seconds   External Rotation AAROM;Right;5 reps  Using dowel rod, holding 5 seconds   Flexion AAROM;Right;10 reps  Using dowel rod, holding 5 seconds   Other Supine Exercises Above is review of HEP she was given last visit.     Manual Therapy   Manual Therapy Myofascial release;Scapular mobilization   Myofascial Release across right axilla, crosshands technique;  O-A release and TMJ release in supine   Scapular Mobilization to right scapula in left sidelying with movement into protraction and depression   Passive ROM In Supine: to Rt shoulder into flexion,  abduction, er all to pts tolerance; supine over towel roll pect minor stretch                        Long Term Clinic Goals - 07/20/16 [id number]      CC Long Term Goal  #1   Title Pt. will be independent in HEP for right shoulder ROM   Baseline Issued intial HEP today-07/20/16   Status On-going     CC Long Term Goal  #2   Title Right shoulder flexion to at least 150 degrees actively for improved overhead reach   Status On-going     CC Long Term Goal  #3   Title right shoulder active abduction to at least 160 degrees for improved ADLs   Status On-going     CC Long Term Goal  #4   Title right shoulder area feeling of tightness decreased by at least 50%   Status On-going     CC Long Term Goal  #5   Title Quick DASH score reduced to 30 or less   Status On-going            Plan - 07/24/16 1138    Clinical Impression Statement Patient reported at least feeling more relaxed at end of session today.  She did need review of HEP and we did this today.   Rehab Potential Good   Clinical Impairments Affecting Rehab Potential prior radiation over implant   PT Frequency 2x / week   PT Duration 4 weeks   PT Treatment/Interventions ADLs/Self Care Home Management;Electrical Stimulation;Therapeutic exercise;Patient/family education;Manual techniques;Scar mobilization;Passive range of motion   PT Next Visit Plan Cont P/AA/A/ROM and myofascial release to Rt axilla/chest; review HEP for right shoulder ROM and progress prn. If Dr. Lindi Adie sends order for Rt hip pain, PT to add this to POC, and possibly IFC??   Consulted and Agree with Plan of Care Patient      Patient will benefit from skilled therapeutic intervention in order to improve the following deficits and impairments:  Decreased range of motion, Impaired flexibility, Impaired UE functional use, Pain  Visit Diagnosis: Stiffness of right shoulder, not elsewhere classified  Right shoulder pain, unspecified  chronicity  Abnormal posture     Problem List Patient Active Problem List   Diagnosis Date Noted  . Breast cancer of upper-outer quadrant of right female breast ([place]) 11/08/2015  . 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 Cough 12/12/2012    Orene Abbasi 07/24/2016, 11:41 AM  [place], [place], [address] Phone: [phone]   Fax:  [phone]  Name: Adrena Yarbro MRN: [id number] Date of Birth: [birth year]   Serafina Royals, PT 07/24/16 11:41 AM

## 2016-07-29 ENCOUNTER — Other Ambulatory Visit: Payer: Self-pay | Admitting: Hematology and Oncology

## 2016-07-29 ENCOUNTER — Ambulatory Visit
Admission: RE | Admit: 2016-07-29 | Discharge: 2016-07-29 | Disposition: A | Discharge status: Home / Self Care | Payer: Managed Care, Other (non HMO) | Source: Ambulatory Visit | Attending: Hematology and Oncology | Admitting: Hematology and Oncology

## 2016-07-29 ENCOUNTER — Ambulatory Visit: Payer: Managed Care, Other (non HMO) | Admitting: Physical Therapy

## 2016-07-29 ENCOUNTER — Other Ambulatory Visit: Payer: Self-pay

## 2016-07-29 DIAGNOSIS — C50411 Malignant neoplasm of upper-outer quadrant of right female breast: Secondary | ICD-10-CM

## 2016-07-29 DIAGNOSIS — Z170 Estrogen receptor positive status [ER+]: Principal | ICD-10-CM

## 2016-07-29 DIAGNOSIS — N63 Unspecified lump in unspecified breast: Secondary | ICD-10-CM

## 2016-07-29 DIAGNOSIS — M25611 Stiffness of right shoulder, not elsewhere classified: Secondary | ICD-10-CM | POA: Diagnosis not present

## 2016-07-29 DIAGNOSIS — N6320 Unspecified lump in the left breast, unspecified quadrant: Secondary | ICD-10-CM

## 2016-07-29 DIAGNOSIS — M25511 Pain in right shoulder: Secondary | ICD-10-CM

## 2016-07-29 NOTE — Therapy (Signed)
[place], [place], [address] Phone: [phone]   Fax:  [phone]  Physical Therapy Treatment  Patient Details  Name: [name] MRN: [id number] Date of Birth: 03/24/1966 Referring Provider: Dr. Audelia Hives  Encounter Date: 07/29/2016      PT End of Session - 07/29/16 1715    Visit Number 4   Number of Visits 9   Date for PT Re-Evaluation 08/16/16   PT Start Time 0938   PT Stop Time 1017   PT Time Calculation (min) 39 min   Activity Tolerance Patient tolerated treatment well   Behavior During Therapy [facility] for tasks assessed/performed      Past Medical History:  Diagnosis Date  . Anxiety   . Breast cancer ([place])   . History of radiation therapy 04/01/16-05/13/16   right chest wall 45 Gy, right chest wall boost 10 Gy  . HTN (hypertension)    no meds now  . Psoriatic arthritis ([place])   . Sebaceous cyst    chest    Past Surgical History:  Procedure Laterality Date  . BREAST RECONSTRUCTION WITH PLACEMENT OF TISSUE EXPANDER AND FLEX HD (ACELLULAR HYDRATED DERMIS) Right 01/09/2016   Procedure: BREAST RECONSTRUCTION WITH PLACEMENT OF TISSUE EXPANDER AND FLEX HD (ACELLULAR HYDRATED DERMIS);  Surgeon: Loel Lofty Dillingham, DO;  Location: [place];  Service: Plastics;  Laterality: Right;  . MASS EXCISION N/A 05/25/2016   Procedure: EXCISION sebaceous cyst;  Surgeon: Loel Lofty Dillingham, DO;  Location: [place];  Service: Plastics;  Laterality: N/A;  . MASTOPEXY Left 02/27/2016   Procedure: LEFT BREAST MASTOPEXY;  Surgeon: Wallace Going, DO;  Location: [place];  Service: Plastics;  Laterality: Left;  . REMOVAL OF TISSUE EXPANDER AND PLACEMENT OF IMPLANT Right 02/27/2016   Procedure: REMOVAL OF RIGHT  TISSUE EXPANDERS WITH PLACEMENT OF RIGHT  BREAST IMPLANTS ;  Surgeon: Wallace Going, DO;  Location: [place];  Service: Plastics;  Laterality: Right;  . SIMPLE  MASTECTOMY WITH AXILLARY SENTINEL NODE BIOPSY Right 01/09/2016   Procedure: INJECTION BLUE DYE RIGHT BREAST,  TOTAL MASTECTOMY WITH  RIGHT AXILLARY SENTINEL NODE BIOPSY;  Surgeon: Fanny Skates, MD;  Location: [place];  Service: General;  Laterality: Right;  . TONSILLECTOMY    . TOTAL ABDOMINAL HYSTERECTOMY      There were no vitals filed for this visit.      Subjective Assessment - 07/29/16 0940    Subjective "I seem to be getting more neck and back pain.  I'm not sleeping at all, so maybe it's just the tossing and turning."   Currently in Pain? Yes   Pain Score 8    Pain Location Neck   Pain Descriptors / Indicators Tightness   Aggravating Factors  not sleeping well   Pain Relieving Factors nothing            [facility] PT Assessment - 07/29/16 0001      AROM   Right Shoulder Flexion 134 Degrees   Right Shoulder ABduction 117 Degrees                     [facility] Adult PT Treatment/Exercise - 07/29/16 0001      Shoulder Exercises: Supine   Protraction AAROM;Both  2 times with hands clasped   Flexion AAROM;Both  hands clasped x 30 second hold   Other Supine Exercises right UE D2 in left sidelying, 5 counts x 5 reps     Manual  Therapy   Myofascial Release O-A release; cervical traction with concurrent sternal distraction; right UE myofascial pulling with movement into abduction to tolerance; in left sidelying, release/stretch with right arm in abduction and pull at lower right flank toward hip   Scapular Mobilization to right scapula in left sidelying with movement into protraction and depression   Passive ROM gentle cervical traction; cervical sidebend with concurrent depression of shoulder; right levator stretch; right shoulder er, abduction, and flexion to tolerance     Prosthetics   Current prosthetic wear tolerance (days/week)                           Long Term Clinic Goals - 07/29/16 1717      CC Long Term Goal  #1   Title Pt. will be  independent in HEP for right shoulder ROM   Status On-going     CC Long Term Goal  #2   Title Right shoulder flexion to at least 150 degrees actively for improved overhead reach   Status On-going     CC Long Term Goal  #3   Title right shoulder active abduction to at least 160 degrees for improved ADLs   Status On-going     CC Long Term Goal  #4   Title right shoulder area feeling of tightness decreased by at least 50%   Status On-going     CC Long Term Goal  #5   Title Quick DASH score reduced to 30 or less   Status On-going            Plan - 07/29/16 1715    Clinical Impression Statement Patient did well with session today, though she has a number of significant pain complaints, and so far is unable to say how much therapy is helping.  She showed a nice 10 degree increase in right shoulder active abduction, but active flexion remains about what it was at evaluation.     Rehab Potential Good   Clinical Impairments Affecting Rehab Potential prior radiation over implant   PT Frequency 2x / week   PT Duration 4 weeks   PT Treatment/Interventions ADLs/Self Care Home Management;Electrical Stimulation;Therapeutic exercise;Patient/family education;Manual techniques;Scar mobilization;Passive range of motion   PT Next Visit Plan Cont P/AA/A/ROM and myofascial release to Rt axilla/chest; review HEP for right shoulder ROM and progress prn. If Dr. Lindi Adie sends order for Rt hip pain, PT to add this to POC, and possibly IFC??   Consulted and Agree with Plan of Care Patient      Patient will benefit from skilled therapeutic intervention in order to improve the following deficits and impairments:  Decreased range of motion, Impaired flexibility, Impaired UE functional use, Pain  Visit Diagnosis: Stiffness of right shoulder, not elsewhere classified  Right shoulder pain, unspecified chronicity     Problem List Patient Active Problem List   Diagnosis Date Noted  . Breast cancer of  upper-outer quadrant of right female breast ([place]) 11/08/2015  . 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 Cough 12/12/2012    SALISBURY,DONNA 07/29/2016, 5:18 PM  [place], [place], [address] Phone: [phone]   Fax:  [phone]  Name: [name] MRN: [id number] Date of Birth: 01-Apr-1966  Serafina Royals, PT 07/29/16 5:19 PM

## 2016-07-30 ENCOUNTER — Ambulatory Visit
Admission: RE | Admit: 2016-07-30 | Discharge: 2016-07-30 | Disposition: A | Discharge status: Home / Self Care | Payer: Managed Care, Other (non HMO) | Source: Ambulatory Visit | Attending: Hematology and Oncology | Admitting: Hematology and Oncology

## 2016-07-30 DIAGNOSIS — N6320 Unspecified lump in the left breast, unspecified quadrant: Secondary | ICD-10-CM

## 2016-07-31 ENCOUNTER — Encounter: Payer: 59 | Admitting: Physical Therapy

## 2016-08-03 ENCOUNTER — Encounter: Payer: 59 | Admitting: Physical Therapy

## 2016-08-03 ENCOUNTER — Inpatient Hospital Stay: Admission: RE | Admit: 2016-08-03 | Payer: 59 | Source: Ambulatory Visit

## 2016-08-06 ENCOUNTER — Ambulatory Visit
Admission: RE | Admit: 2016-08-06 | Discharge: 2016-08-06 | Disposition: A | Discharge status: Home / Self Care | Payer: Managed Care, Other (non HMO) | Source: Ambulatory Visit | Attending: Radiation Oncology | Admitting: Radiation Oncology

## 2016-08-06 ENCOUNTER — Encounter: Payer: Self-pay | Admitting: Radiation Oncology

## 2016-08-06 DIAGNOSIS — C50411 Malignant neoplasm of upper-outer quadrant of right female breast: Secondary | ICD-10-CM | POA: Diagnosis present

## 2016-08-06 DIAGNOSIS — Z170 Estrogen receptor positive status [ER+]: Secondary | ICD-10-CM | POA: Insufficient documentation

## 2016-08-06 NOTE — Progress Notes (Signed)
She is currently in no pain. Pulling/tightness to Right breast arm.   Pt right breast- warm, dry intact, occasional tenderness.  Pt denies edema. Pt denies using vitamin E cream BP 127/72   Pulse 71   Temp 97.5 F (36.4 C) (Oral)   Resp 12   Wt 163 lb 3.2 oz (74 kg)   SpO2 100%   BMI 26.34 kg/m  Wt Readings from Last 3 Encounters:  08/06/16 163 lb 3.2 oz (74 kg)  07/22/16 165 lb (74.8 kg)  06/16/16 168 lb 9.6 oz (76.5 kg)   Pt reports: Yes No Comments  Nolvadex (tamoxifen) []  [x]    Arimidex (anastrozole) []  [x]    Last Med Onc Appt: Date: Next:09/21/16 Doctor: Lindi Adie  Last Mammogram: Date: Next:   Survivorship Appt: Date:08/24/16 [] [facility] pamphlet []  [facility] pamphlet    Will start Femara January 1st 2018

## 2016-08-06 NOTE — Progress Notes (Signed)
Radiation Oncology         [phone] ________________________________  Name: [name] MRN: [id number]  Date: 08/06/2016  DOB: 05-30-1966  Follow-Up Visit Note  CC: Shirline Frees, MD  Nicholas Lose, MD    ICD-9-CM ICD-10-CM   1. 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  The patient denies pain currently. She reports a pulling/tightness to her right breast and arm, with occasional tenderness to the right chest. She reports she recently felt a lump on her left breast prompting a biopsy last week, which she reports was negative for malignancy. She denies edema. She reports she is using Vitamin E cream on her skin in the treatment field. She is having profound musculoskeletal aches and pains and made her quality of life extremely poor. Anastrozole was subsequently discontinued in light of this issue. Patient however continues to have a lot of musculoskeletal aches and pains and is unsure whether this is related to adjuvant hormonal therapy or possibly to her psoriatic arthritis.  Due to prior difficult recovery, the patient is not interested in any more reconstructive surgery at this time. The patient is scheduled to start Femara 08/17/16. She reports she is scheduled to follow up with Dr. Lindi Adie on 09/21/16. She has an upcoming Survivorship appointment on 08/24/16.         ALLERGIES:  is allergic to xanax [alprazolam] and tape.  Meds: Current Outpatient Prescriptions  Medication Sig Dispense Refill  . Apremilast 30 MG TABS Take 1 tablet by mouth 2 (two) times daily. 60 tablet 2  . Ginger,  Zingiber officinalis, (GINGER EXTRACT PO) Take by mouth.    . letrozole (FEMARA) 2.5 MG tablet Take 1 tablet (2.5 mg total) by mouth daily. (Patient not taking: Reported on 07/24/2016) 30 tablet 0  . LORazepam (ATIVAN) 1 MG tablet Take 1 mg by mouth every 6 (six) hours as needed for anxiety.     . Misc Natural Products (TART CHERRY ADVANCED PO) Take by mouth.    . Multiple Vitamins-Minerals (MULTIVITAMIN WITH MINERALS) tablet Take 1 tablet by mouth daily.    . Omega-3 Fatty Acids (FISH OIL PO) Take by mouth.    . Saccharomyces boulardii (PROBIOTIC) 250 MG CAPS Take 250 mg by mouth daily.     . TURMERIC PO Take by mouth.    . Wound Dressings (SONAFINE EX) Apply topically.     No current facility-administered medications for this encounter.     Physical Findings: The patient is in no acute distress. Patient is alert and oriented.  weight is 163 lb 3.2 oz (74 kg). Her oral temperature is 97.5 F (36.4 C). Her blood pressure is 127/72 and her pulse is 71. Her respiration is 12 and oxygen saturation is 100%.   No significant changes. Lungs are clear to auscultation bilaterally. Heart has regular rate and rhythm, no murmurs. No palpable cervical, supraclavicular, or axillary adenopathy. Abdomen soft, non-tender, normal bowel sounds. On examination of the left breast, the patient has a couple of Band-aids in place along lateral aspect of breast from recent biopsy, with some bruising around the site. No palpable masses noted. Signs  of prior breast reduction surgery. On examination of the right breast, right chest mastectomy scar in place, implant in place, no palpable or visible signs of recurrence. Skin is well healed. Faint hyperpigmentation changes.  Lab Findings: Lab Results  Component Value Date   WBC 5.2 05/25/2016   HGB 12.3 05/25/2016   HCT 37.7 05/25/2016   MCV 93.3 05/25/2016   PLT 125 (L) 05/25/2016    Radiographic Findings: US Breast Ltd Uni Left Inc Axilla  Result Date:  07/29/2016 CLINICAL DATA:  50 year old female presenting for evaluation of a palpable areas of concern in the left breast. EXAM: 2D DIGITAL DIAGNOSTIC UNILATERAL LEFT MAMMOGRAM WITH CAD AND ADJUNCT TOMO LEFT BREAST ULTRASOUND COMPARISON:  Previous exam(s). ACR Breast Density Category c: The breast tissue is heterogeneously dense, which may obscure small masses. FINDINGS: Two palpable markers have been placed on the left breast, 1 in the superior left breast and the other on the lateral breast. There are no new suspicious mammographic abnormalities identified deep to the palpable markers, however there does appear to be an obscured mass in the lateral aspect of the left breast which does not appear mammographically significantly changed. There is a top hat shaped biopsy marking clip which is new since the prior mammogram in the lower outer quadrant of the left breast at the site of a benign MRI guided biopsy in April of 2017. Mammographic images were processed with CAD. Physical exam of the lateral left breast demonstrates a soft mobile mass at the approximate 3 o'clock location. At the time of the ultrasound, the patient indicated that the second palpable site of concern was at the margin of the areola just deep to the surgical scar. I do not feel any palpable masses at this site. Ultrasound targeted to the left breast at 3 o'clock, 5 cm from the nipple demonstrates a predominantly circumscribed oval hypoechoic mass with a small angulated margin measuring 1.0 x 0.5 x 0.8 cm. A small amount of blood flow was identified in the periphery of this mass on color Doppler imaging. Ultrasound of the palpable site at 1 o'clock, 1 cm from the nipple demonstrates multiple anechoic circumscribed masses, compatible with benign cysts. Ultrasound of the left axilla demonstrates multiple normal-appearing lymph nodes. IMPRESSION: 1. There is an indeterminate 1.0 cm mass at the palpable site in the left breast at 3 o'clock. 2.  Multiple benign cysts are identified at the second palpable site at 1 o'clock in the left breast. 3.  No evidence of left axillary lymphadenopathy. RECOMMENDATION: Ultrasound-guided biopsy is recommended for the left breast mass at 3 o'clock. This has been scheduled for 07/30/2016 at 12:45 p.m. I have discussed the findings and recommendations with the patient. Results were also provided in writing at the conclusion of the visit. If applicable, a reminder letter will be sent to the patient regarding the next appointment. BI-RADS CATEGORY  4: Suspicious. Electronically Signed   By: Ammie Ferrier M.D.   On: 07/29/2016 13:04   Mm Diag Breast Tomo Uni Left  Result Date: 07/29/2016 CLINICAL DATA:  50 year old female presenting for evaluation of a palpable areas of concern in the left breast. EXAM: 2D DIGITAL DIAGNOSTIC UNILATERAL LEFT MAMMOGRAM WITH CAD AND ADJUNCT TOMO LEFT BREAST ULTRASOUND COMPARISON:  Previous exam(s). ACR Breast Density Category c: The breast tissue is heterogeneously dense, which may obscure small masses. FINDINGS: Two palpable markers have been placed on the left breast, 1 in the superior left breast and the other on the lateral breast. There are  no new suspicious mammographic abnormalities identified deep to the palpable markers, however there does appear to be an obscured mass in the lateral aspect of the left breast which does not appear mammographically significantly changed. There is a top hat shaped biopsy marking clip which is new since the prior mammogram in the lower outer quadrant of the left breast at the site of a benign MRI guided biopsy in April of 2017. Mammographic images were processed with CAD. Physical exam of the lateral left breast demonstrates a soft mobile mass at the approximate 3 o'clock location. At the time of the ultrasound, the patient indicated that the second palpable site of concern was at the margin of the areola just deep to the surgical scar. I do not  feel any palpable masses at this site. Ultrasound targeted to the left breast at 3 o'clock, 5 cm from the nipple demonstrates a predominantly circumscribed oval hypoechoic mass with a small angulated margin measuring 1.0 x 0.5 x 0.8 cm. A small amount of blood flow was identified in the periphery of this mass on color Doppler imaging. Ultrasound of the palpable site at 1 o'clock, 1 cm from the nipple demonstrates multiple anechoic circumscribed masses, compatible with benign cysts. Ultrasound of the left axilla demonstrates multiple normal-appearing lymph nodes. IMPRESSION: 1. There is an indeterminate 1.0 cm mass at the palpable site in the left breast at 3 o'clock. 2. Multiple benign cysts are identified at the second palpable site at 1 o'clock in the left breast. 3.  No evidence of left axillary lymphadenopathy. RECOMMENDATION: Ultrasound-guided biopsy is recommended for the left breast mass at 3 o'clock. This has been scheduled for 07/30/2016 at 12:45 p.m. I have discussed the findings and recommendations with the patient. Results were also provided in writing at the conclusion of the visit. If applicable, a reminder letter will be sent to the patient regarding the next appointment. BI-RADS CATEGORY  4: Suspicious. Electronically Signed   By: Ammie Ferrier M.D.   On: 07/29/2016 13:04   Mm Clip Placement Left  Result Date: 07/30/2016 CLINICAL DATA:  Post biopsy clip images of the left breast. EXAM: DIAGNOSTIC LEFT MAMMOGRAM POST ULTRASOUND BIOPSY COMPARISON:  Previous exam(s). FINDINGS: Mammographic images were obtained following altered guided biopsy of a left breast lesion. The ribbon shaped biopsy clip lies in the lateral left breast in the expected location of the small left breast mass. IMPRESSION: Well-positioned ribbon shaped biopsy clip following ultrasound-guided core needle biopsy of the left breast. Final Assessment: Post Procedure Mammograms for Marker Placement Electronically Signed   By:  Lajean Manes M.D.   On: 07/30/2016 13:40   [place] Lt Breast Bx W Loc Dev 1st Lesion Img Bx Spec US Guide  Addendum Date: 07/31/2016   ADDENDUM REPORT: 07/31/2016 12:57 ADDENDUM: Pathology revealed FIBROCYSTIC CHANGES, PSEUDOANGIOMATOUS STROMAL HYPERPLASIA ([place]), of the Left breast, upper outer near 3 o'clock. This was found to be concordant by Dr. Lajean Manes. Pathology results were discussed with the patient by telephone. The patient reported doing well after the biopsy with tenderness at the site. Post biopsy instructions and care were reviewed and questions were answered. The patient was encouraged to call [facility] for any additional concerns. The patient was instructed to return for annual diagnostic mammography and informed a reminder notice would be sent regarding this appointment. Pathology results reported by Terie Purser, RN on 07/31/2016. Electronically Signed   By: Lajean Manes M.D.   On: 07/31/2016 12:57   Result Date:  07/31/2016 CLINICAL DATA:  Patient presents for ultrasound-guided core needle biopsy of a left breast mass. Patient is being treated for right breast carcinoma. EXAM: ULTRASOUND GUIDED LEFT BREAST CORE NEEDLE BIOPSY COMPARISON:  Previous exam(s). FINDINGS: I met with the patient and we discussed the procedure of ultrasound-guided biopsy, including benefits and alternatives. We discussed the high likelihood of a successful procedure. We discussed the risks of the procedure, including infection, bleeding, tissue injury, clip migration, and inadequate sampling. Informed written consent was given. The usual time-out protocol was performed immediately prior to the procedure. Using sterile technique and 1% Lidocaine as local anesthetic, under direct ultrasound visualization, a 12 gauge spring-loaded device was used to perform biopsy of a small left breast mass using an inferior approach. At the conclusion of the procedure a ribbon shaped tissue marker clip  was deployed into the biopsy cavity. Follow up 2 view mammogram was performed and dictated separately. IMPRESSION: Ultrasound guided biopsy of a left breast mass. No apparent complications. Electronically Signed: By: Lajean Manes M.D. On: 07/30/2016 13:23    Impression:  No evidence of recurrence on clinical exam today  Plan: When necessary follow-up in radiation oncology. The patient will continue close follow-up with medical oncology in light of her adjuvant hormonal therapy  -----------------------------------  Blair Promise, PhD, MD  This document serves as a record of services personally performed by Gery Pray, MD. It was created on his behalf by Maryla Morrow, a trained medical scribe. The creation of this record is based on the scribe's personal observations and the provider's statements to them. This document has been checked and approved by the attending provider.

## 2016-08-07 ENCOUNTER — Ambulatory Visit: Payer: Managed Care, Other (non HMO) | Admitting: Physical Therapy

## 2016-08-13 ENCOUNTER — Encounter: Payer: Managed Care, Other (non HMO) | Admitting: Adult Health

## 2016-08-21 ENCOUNTER — Ambulatory Visit
Admission: RE | Admit: 2016-08-21 | Discharge: 2016-08-21 | Disposition: A | Discharge status: Home / Self Care | Payer: Managed Care, Other (non HMO) | Source: Ambulatory Visit | Attending: Hematology and Oncology | Admitting: Hematology and Oncology

## 2016-08-21 DIAGNOSIS — Z780 Asymptomatic menopausal state: Secondary | ICD-10-CM

## 2016-08-23 ENCOUNTER — Other Ambulatory Visit: Payer: Self-pay | Admitting: Hematology and Oncology

## 2016-08-24 ENCOUNTER — Ambulatory Visit: Payer: Managed Care, Other (non HMO) | Attending: Plastic Surgery | Admitting: Physical Therapy

## 2016-08-24 ENCOUNTER — Ambulatory Visit (HOSPITAL_BASED_OUTPATIENT_CLINIC_OR_DEPARTMENT_OTHER): Payer: Managed Care, Other (non HMO) | Admitting: Adult Health

## 2016-08-24 ENCOUNTER — Ambulatory Visit: Payer: Managed Care, Other (non HMO) | Admitting: Hematology and Oncology

## 2016-08-24 ENCOUNTER — Encounter: Payer: Self-pay | Admitting: Adult Health

## 2016-08-24 VITALS — BP 102/67 | HR 68 | Temp 97.4°F | Resp 18 | Ht 66.0 in | Wt 161.5 lb

## 2016-08-24 DIAGNOSIS — M25611 Stiffness of right shoulder, not elsewhere classified: Secondary | ICD-10-CM | POA: Insufficient documentation

## 2016-08-24 DIAGNOSIS — M25612 Stiffness of left shoulder, not elsewhere classified: Secondary | ICD-10-CM | POA: Insufficient documentation

## 2016-08-24 DIAGNOSIS — F329 Major depressive disorder, single episode, unspecified: Secondary | ICD-10-CM | POA: Diagnosis not present

## 2016-08-24 DIAGNOSIS — C50411 Malignant neoplasm of upper-outer quadrant of right female breast: Secondary | ICD-10-CM | POA: Diagnosis not present

## 2016-08-24 DIAGNOSIS — N6092 Unspecified benign mammary dysplasia of left breast: Secondary | ICD-10-CM

## 2016-08-24 DIAGNOSIS — M25511 Pain in right shoulder: Secondary | ICD-10-CM | POA: Insufficient documentation

## 2016-08-24 DIAGNOSIS — Z170 Estrogen receptor positive status [ER+]: Secondary | ICD-10-CM | POA: Diagnosis not present

## 2016-08-24 DIAGNOSIS — F418 Other specified anxiety disorders: Secondary | ICD-10-CM

## 2016-08-24 DIAGNOSIS — R634 Abnormal weight loss: Secondary | ICD-10-CM

## 2016-08-24 MED ORDER — LETROZOLE 2.5 MG PO TABS: 2.5000 mg | ORAL_TABLET | Freq: Every day | ORAL | 0 refills | Status: DC

## 2016-08-24 MED ORDER — CITALOPRAM HYDROBROMIDE 20 MG PO TABS: 20.0000 mg | ORAL_TABLET | Freq: Every day | ORAL | 6 refills | Status: DC

## 2016-08-24 NOTE — Therapy (Signed)
[place], [place], [address] Phone: [phone]   Fax:  [phone]  Physical Therapy Treatment  Patient Details  Name: [name] MRN: [id number] Date of Birth: 05-05-1966 Referring Provider: Dr. Audelia Hives  Encounter Date: 08/24/2016      PT End of Session - 08/24/16 1241    Visit Number 5   Number of Visits 13   Date for PT Re-Evaluation 09/25/16   PT Start Time 0800   PT Stop Time 0844   PT Time Calculation (min) 44 min   Activity Tolerance Patient tolerated treatment well   Behavior During Therapy [facility] for tasks assessed/performed      Past Medical History:  Diagnosis Date  . Anxiety   . Breast cancer ([place])   . History of radiation therapy 04/01/16-05/13/16   right chest wall 45 Gy, right chest wall boost 10 Gy  . HTN (hypertension)    no meds now  . Psoriatic arthritis ([place])   . Sebaceous cyst    chest    Past Surgical History:  Procedure Laterality Date  . BREAST RECONSTRUCTION WITH PLACEMENT OF TISSUE EXPANDER AND FLEX HD (ACELLULAR HYDRATED DERMIS) Right 01/09/2016   Procedure: BREAST RECONSTRUCTION WITH PLACEMENT OF TISSUE EXPANDER AND FLEX HD (ACELLULAR HYDRATED DERMIS);  Surgeon: Loel Lofty Dillingham, DO;  Location: [place];  Service: Plastics;  Laterality: Right;  . MASS EXCISION N/A 05/25/2016   Procedure: EXCISION sebaceous cyst;  Surgeon: Loel Lofty Dillingham, DO;  Location: [place];  Service: Plastics;  Laterality: N/A;  . MASTOPEXY Left 02/27/2016   Procedure: LEFT BREAST MASTOPEXY;  Surgeon: Wallace Going, DO;  Location: [place];  Service: Plastics;  Laterality: Left;  . REMOVAL OF TISSUE EXPANDER AND PLACEMENT OF IMPLANT Right 02/27/2016   Procedure: REMOVAL OF RIGHT  TISSUE EXPANDERS WITH PLACEMENT OF RIGHT  BREAST IMPLANTS ;  Surgeon: Wallace Going, DO;  Location: [place];  Service: Plastics;  Laterality: Right;  . SIMPLE  MASTECTOMY WITH AXILLARY SENTINEL NODE BIOPSY Right 01/09/2016   Procedure: INJECTION BLUE DYE RIGHT BREAST,  TOTAL MASTECTOMY WITH  RIGHT AXILLARY SENTINEL NODE BIOPSY;  Surgeon: Fanny Skates, MD;  Location: [place];  Service: General;  Laterality: Right;  . TONSILLECTOMY    . TOTAL ABDOMINAL HYSTERECTOMY      There were no vitals filed for this visit.      Subjective Assessment - 08/24/16 0801    Subjective "I had another scare so had to get a biopsy in the left breast. Thank god it was a fibrotic cyst, or something like that."  Also has been away for a while because daughter was out of school.  Also started a new cancer pill on 12/31.   Currently in Pain? Yes   Pain Score 6    Pain Location Shoulder  and right axilla   Pain Orientation Left  left > right   Pain Descriptors / Indicators Discomfort   Aggravating Factors  new cancer pill   Pain Relieving Factors nothing   Multiple Pain Sites Yes   Pain Score 4   Pain Location Hip   Pain Orientation Right   Pain Descriptors / Indicators Other (Comment)  joint pain   Aggravating Factors  new cancer pill?, moving leg to right   Pain Relieving Factors unsure            [facility] PT Assessment - 08/24/16 0001      AROM   Right Shoulder  Flexion 141 Degrees   Right Shoulder ABduction 155 Degrees  into scaption   Left Shoulder Flexion 155 Degrees   Left Shoulder ABduction 164 Degrees                     [facility] Adult PT Treatment/Exercise - 08/24/16 0001      Shoulder Exercises: Supine   Horizontal ABduction AROM;Both;10 reps  supine over towel roll   Other Supine Exercises over towel roll, bilat. UE isometric shoulder extension with scapular retraction x 5     Shoulder Exercises: Seated   Other Seated Exercises shoulder rolls backward x 6; stretch up to ceiling     Manual Therapy   Myofascial Release Gentle pulling of right UE, stationary today; release across right axilla.   Passive ROM In supine, right  shoulder er, abduction, and flexion to tolerance, then same to left. Supine over towel roll, bilat. pect minor stretches x 30 seconds x 2.                        [facility] Goals - 08/24/16 1245      [facility] Long Term Goal  #1   Title Pt. will be independent in HEP for right shoulder ROM   Status On-going     CC Long Term Goal  #2   Title Right shoulder flexion to at least 150 degrees actively for improved overhead reach   Status On-going     CC Long Term Goal  #3   Title right shoulder active abduction to at least 160 degrees for improved ADLs   Status On-going     CC Long Term Goal  #4   Title right shoulder area feeling of tightness decreased by at least 50%   Status On-going     CC Long Term Goal  #5   Title Quick DASH score reduced to 30 or less   Status On-going            Plan - 08/24/16 1242    Clinical Impression Statement Patient has gained some right shoulder AROM in the past few weeks, despite not having been able to come to therapy: She had a scare with a lump in the left breast, which she had biopsied and was found to be benign.  She does still have limited right shoulder ROM, and now also c/o left shoulder pain. Her left shoulder AROM has decreased some with this new onset of pain.  She has right hip pain; this had improved after onset but has worsened again, so that she may ask one of her doctors about it.   Rehab Potential Good   Clinical Impairments Affecting Rehab Potential prior radiation over implant   PT Frequency 2x / week   PT Duration 4 weeks   PT Treatment/Interventions ADLs/Self Care Home Management;Electrical Stimulation;Therapeutic exercise;Patient/family education;Manual techniques;Scar mobilization;Passive range of motion   PT Next Visit Plan Cont P/AA/A/ROM and myofascial release to Rt axilla/chest and left shoulder ROM also; review HEP for right shoulder ROM and progress prn.   Consulted and Agree with Plan of Care Patient       Patient will benefit from skilled therapeutic intervention in order to improve the following deficits and impairments:  Decreased range of motion, Impaired flexibility, Impaired UE functional use, Pain  Visit Diagnosis: Stiffness of right shoulder, not elsewhere classified - Plan: PT plan of care cert/re-cert  Right shoulder pain, unspecified chronicity - Plan: PT plan of care cert/re-cert  Stiffness of left shoulder, not elsewhere classified - Plan: PT plan of care cert/re-cert     Problem List Patient Active Problem List   Diagnosis Date Noted  . Breast cancer of upper-outer quadrant of right female breast ([place]) 11/08/2015  . 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 Cough 12/12/2012    Deronte Solis 08/24/2016, 12:49 PM  [place], [place], [address] Phone: [phone]   Fax:  [phone]  Name: [name] MRN: [id number] Date of Birth: [birth year]  Serafina Royals, PT 08/24/16 12:49 PM

## 2016-08-24 NOTE — Progress Notes (Signed)
CLINIC:  Survivorship   REASON FOR VISIT:  Routine follow-up post-treatment for a recent history of breast cancer.  BRIEF ONCOLOGIC HISTORY:    Breast cancer of upper-outer quadrant of right female breast ([place])   11/07/2015 Initial Diagnosis    Right breast biopsy 12:30 position: Invasive ductal carcinoma grade 2, ER/PR positive, HER-2 negative ratio 1.17, Ki-67 7%, ultrasound revealed 2.7 cm mass, T2 N0 stage II a clinical stage      01/09/2016 Surgery    Right mastectomy [name]): IDC, 2 tumors, 3.2 cm and 2.2 cm, 2/3 sentinel nodes positive, focal extranodal extension, 2 additional nodes negative, ER 100%, PR 90%, HER-2 negative ratio 1.16, Ki-67 10% and 7%, T2(multifocal) N1 a stage IIB      01/09/2016 Miscellaneous    Mammaprint: Luminal type A, low risk (did not need chemotherapy)      04/01/2016 - 05/13/2016 Radiation Therapy    Adjuvant radiation therapy ([place]). Right chest wall - 4 Field: 45 Gy in 25 fractions.   Right chest wall scar boost: 10 Gy in 5 fractions.      05/18/2016 - 07/22/2016 Anti-estrogen oral therapy    Anastrozole 1 mg daily; discontinued on 07/22/16 d/t side effects.       08/17/2016 -  Anti-estrogen oral therapy    Letrozole (start with 1.25 mg daily, then increase to full 2.5 mg daily dose).        INTERVAL HISTORY:  [name] presents to the [facility] today for our initial meeting to review her survivorship care plan detailing her treatment course for breast cancer, as well as monitoring long-term side effects of that treatment, education regarding health maintenance, screening, and overall wellness and health promotion.     Overall, [name] reports feeling quite well since completing her radiation therapy approximately 3.5 months ago.  She initially Started antiestrogen therapy with Anastrozole in 05/2016; this was subsequently discontinued in 07/2016 due to intolerable side effects. She started Letrozole all on 08/17/16, beginning with 1/2  pill (1.25 mg daily).  She initially had slight nausea and dull headache with the letrozole all, but this has resolved. It is difficult for her to tell if her arthralgias are related to her psoriatic arthritis or if they are related to the aromatase inhibitor therapy.  The arthralgias are very troubling for her, particularly to her right hip.   In reviewing her records, she has lost about 60 pounds since 12/2015. She tells me she has had decreased appetite and nausea during this time period.  When she began losing weight unintentionally, she expressed that she did change the way she eats and started making different food options.  She tells me she did gain quite a bit of weight in recent years (highest weight was 228 lbs she reports).  She tells me her baseline weight was in the 140s.    She reports fatigue. She tells me she has felt very anxious and sad/tearful lately.  "I think about this cancer from the moment I wake up in the morning to the time I go to bed at night."  She doesn't sleep well.   Since her last visit to the cancer center, she did have a left breast diagnostic mammogram on 07/29/16 for a palpated lump. She did go on to have left breast biopsy on 07/30/16, which was negative for malignancy per her report. She is not sure when she needs to go back to have her left breast mammogram imaging done again.  REVIEW OF SYSTEMS:  Review of Systems  Constitutional: Positive for appetite change, fatigue and unexpected weight change.  HENT:  Negative.   Eyes: Negative.   Respiratory: Negative.   Cardiovascular: Negative.   Gastrointestinal: Positive for nausea.  Endocrine: Negative.   Genitourinary: Negative.  Negative for vaginal bleeding.   Musculoskeletal: Positive for arthralgias and myalgias.  Skin: Negative.   Neurological: Negative.   Hematological: Negative.   Psychiatric/Behavioral: Positive for depression and sleep disturbance. The patient is nervous/anxious.   Breast: Recent  left breast biopsy in 07/2016.   A 14-point review of systems was completed and was negative, except as noted above.   ONCOLOGY TREATMENT TEAM:  1. Surgeon:  Dr. Dalbert Olson at [facility] 2. Medical Oncologist: Dr. Lindi Adie 3. Radiation Oncologist: Dr. Sondra Come    PAST MEDICAL/SURGICAL HISTORY:  Past Medical History:  Diagnosis Date  . Anxiety   . Breast cancer ([place])   . History of radiation therapy 04/01/16-05/13/16   right chest wall 45 Gy, right chest wall boost 10 Gy  . HTN (hypertension)    no meds now  . Psoriatic arthritis ([place])   . Sebaceous cyst    chest   Past Surgical History:  Procedure Laterality Date  . BREAST RECONSTRUCTION WITH PLACEMENT OF TISSUE EXPANDER AND FLEX HD (ACELLULAR HYDRATED DERMIS) Right 01/09/2016   Procedure: BREAST RECONSTRUCTION WITH PLACEMENT OF TISSUE EXPANDER AND FLEX HD (ACELLULAR HYDRATED DERMIS);  Surgeon: Loel Lofty Dillingham, DO;  Location: [place];  Service: Plastics;  Laterality: Right;  . MASS EXCISION N/A 05/25/2016   Procedure: EXCISION sebaceous cyst;  Surgeon: Loel Lofty Dillingham, DO;  Location: [place];  Service: Plastics;  Laterality: N/A;  . MASTOPEXY Left 02/27/2016   Procedure: LEFT BREAST MASTOPEXY;  Surgeon: Wallace Going, DO;  Location: [place];  Service: Plastics;  Laterality: Left;  . REMOVAL OF TISSUE EXPANDER AND PLACEMENT OF IMPLANT Right 02/27/2016   Procedure: REMOVAL OF RIGHT  TISSUE EXPANDERS WITH PLACEMENT OF RIGHT  BREAST IMPLANTS ;  Surgeon: Wallace Going, DO;  Location: [place];  Service: Plastics;  Laterality: Right;  . SIMPLE MASTECTOMY WITH AXILLARY SENTINEL NODE BIOPSY Right 01/09/2016   Procedure: INJECTION BLUE DYE RIGHT BREAST,  TOTAL MASTECTOMY WITH  RIGHT AXILLARY SENTINEL NODE BIOPSY;  Surgeon: Fanny Skates, MD;  Location: [place];  Service: General;  Laterality: Right;  . TONSILLECTOMY    . TOTAL ABDOMINAL HYSTERECTOMY       ALLERGIES:  Allergies    Allergen Reactions  . Xanax [Alprazolam] Anaphylaxis and Hives  . Tape Other (See Comments)    Pulled skin off      CURRENT MEDICATIONS:  Outpatient Encounter Prescriptions as of 08/24/2016  Medication Sig Note  . Apremilast 30 MG TABS Take 1 tablet by mouth 2 (two) times daily.   . Ginger, Zingiber officinalis, (GINGER EXTRACT PO) Take by mouth.   . letrozole (FEMARA) 2.5 MG tablet Take 1 tablet (2.5 mg total) by mouth daily.   Marland Kitchen LORazepam (ATIVAN) 1 MG tablet Take 1 mg by mouth every 6 (six) hours as needed for anxiety.    . Misc Natural Products (TART CHERRY ADVANCED PO) Take by mouth.   . Multiple Vitamins-Minerals (MULTIVITAMIN WITH MINERALS) tablet Take 1 tablet by mouth daily.   . Omega-3 Fatty Acids (FISH OIL PO) Take by mouth.   . Saccharomyces boulardii (PROBIOTIC) 250 MG CAPS Take 250 mg by mouth daily.    . TURMERIC PO Take by mouth.   Marland Kitchen  Wound Dressings (SONAFINE EX) Apply topically. 05/20/2016: Refill given 05/20/16   No facility-administered encounter medications on file as of 08/24/2016.      ONCOLOGIC FAMILY HISTORY:  Family History  Problem Relation Age of Onset  . Cancer - Colon Cousin      GENETIC COUNSELING/TESTING: None.   SOCIAL HISTORY:  Brissia Delisa is married and lives with her husband in [place], [place]. She has 55 34 year old child.  She denies any current tobacco, alcohol, or illicit drug use.    PHYSICAL EXAMINATION:  Vital Signs:   Vitals:   08/24/16 0946  BP: 102/67  Pulse: 68  Resp: 18  Temp: 97.4 F (36.3 C)   Filed Weights   08/24/16 0946  Weight: 161 lb 8 oz (73.3 kg)   General: Well-nourished, well-appearing female in no acute distress.  She is unaccompanied today.   HEENT: Head is normocephalic.  Pupils equal and reactive to light. Conjunctivae clear without exudate.  Sclerae anicteric. Oral mucosa is pink, moist.  Oropharynx is pink without lesions or erythema.  Lymph: No cervical, supraclavicular, or infraclavicular  lymphadenopathy noted on palpation.  Cardiovascular: Regular rate and rhythm.Marland Kitchen Respiratory: Clear to auscultation bilaterally. Chest expansion symmetric; breathing non-labored.  GI: Abdomen soft and round; non-tender, non-distended. Bowel sounds normoactive.  GU: Deferred.  Neuro: No focal deficits. Steady gait.  Psych: Anxious mood; tearful at times during visit.   Extremities: No edema. Skin: Warm and dry.  LABORATORY DATA:  None for this visit.  DIAGNOSTIC IMAGING:  Left breast mammogram & ultrasound: 07/29/16    Left breast biopsy: 07/30/16     ASSESSMENT AND PLAN:  Ms.. Olson is a pleasant 51 y.o. female with Stage IIB right breast invasive ductal carcinoma, ER+/PR+/HER2-, diagnosed in 10/2015;  treated with right mastectomy, adjuvant radiation therapy, and anti-estrogen therapy initially beginning with Anastrozole in 05/2016-07/2016, which was stopped d/t side effects.  She resumed anti-estrogen therapy with Letrozole on 08/17/16 at half-dose (1.25 mg/day).  She presents to the Survivorship Clinic for our initial meeting and routine follow-up post-completion of treatment for breast cancer.    1. Stage IIB right breast cancer:  [name] is continuing to recover from definitive treatment for breast cancer. She will continue her anti-estrogen therapy with Letrozole. Currently, she is taking 1.25 mg/day until she sees Dr. Lindi Adie in 1 month.  At that time, as long as she continues to tolerate the medication well, they may decide to increase to full dose Letrozole at 2.5 mg daily.  Thus far, she is tolerating the Letrozole at 1.25 mg/day relatively well with arthralgias being her biggest complaint.  It is difficult to determine whether her arthralgias are coming from her symptomatic psoriatic arthritis versus her aromatase inhibitor therapy. I encouraged her to maintain follow-up with her rheumatologist as directed. She will follow-up with her medical oncologist, Dr. Lindi Adie, in 09/2016 with  history and physical exam per surveillance protocol. Today, a comprehensive survivorship care plan and treatment summary was reviewed with the patient today detailing her breast cancer diagnosis, treatment course, potential late/long-term effects of treatment, appropriate follow-up care with recommendations for the future, and patient education resources.  A copy of this summary, along with a letter will be sent to the patient's primary care provider via mail/fax/In Basket message after today's visit.    2. Unintentional 60 lb weight loss in past 8 months:  [name] has lost about 60 lbs since 12/2015.  According to the patient, this has been the result of decreased  appetite and intermittent nausea, possibly secondary to her medications (both the aromatase inhibitor and her rheumatologic medications).  She tells me that once she began to lose quite a bit of weight, she changed the way she eats "so I could keep the weight off."  We discussed that significant unintentional weight loss in cancer patients is cause for concern.  While I have low suspicion that her weight loss is secondary to metastatic disease, I think restaging with CT chest/abd/pelvis is necessary at this time for further evaluation.  We will try to get this done before the patient returns to the cancer center to see Dr. Lindi Adie on 09/24/16.    3. Left breast PASH: [name] had diagnostic left breast mammogram with ultrasound on 07/29/16 revealing 1 cm suspicious mass requiring biopsy.  Biopsy on 07/30/16 revealed [place] and fibrocystic changes; no evidence of malignancy.  The patient is anxious about this finding, therefore I will place orders for repeat diagnostic unilateral left breast mammogram in 01/2017 for continued surveillance; orders placed today.     4. 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 Given that she is  experiencing frequent intrusive thoughts of anxiety with regards to her cancer on a daily basis, pharmacologic intervention may be necessary to help manage her symptoms.  We discussed different SSRIs, which are used in the treatment of both anxiety and depression.  She was tearful during much of our visit today, but is resistant to "put any more chemicals in my body."  I validated her concerns and explained the rationale of combined pharmacotherapy with professional counseling to help manage her emotional distress.  She tells me she was was on Celexa in the past and tolerated it quite well. It is unclear why her anti-depressant therapy was ever stopped.  We discussed restarting the Celexa with a dose-escalation schedule of 10 mg daily for the first week (to help mitigate any early side effects of treatment), and then proceeding to 20 mg daily thereafter.  Given that SSRI therapy can cause some initial fatigue/drowsiness, I recommended she try taking the medication at bedtime, as it may help her sleep disturbance as well.  She is not sure if she is going to start the Celexa or not; she wants to talk to her husband about it first.  She did ask that I go ahead and send the prescription to her pharmacy and she will pick it up if she decides to start taking it; I sent e-prescription for Celexa 20 mg daily to her [facility] on [address] in [place].  I also think that professional counseling would be very helpful for her.  We discussed that she has access to free counseling here at the cancer center with our counseling interns.  She is very interested in pursuing this for some additional support.  I will place this referral today and they will contact her to get an appointment set-up. She understands that the counseling interns are meant to help with short-term therapy, and should she need long-term assistance, she may need referral to counselor in the community for continued support.  In the meantime,  we will get started with counseling here at the cancer center.  We also discussed an opportunity for her to participate in the next session of [facility] ("Finding Your New Normal") support group series designed for patients after they have completed treatment.  [name] was encouraged to take advantage of our many other support services programs, support groups, and/or counseling in coping with her new life as a cancer survivor after completing anti-cancer treatment.  She was offered support today through active listening and expressive supportive counseling.  She was given information regarding our available services and encouraged to contact me with any questions or for help enrolling in any of our support group/programs.   5. At risk for right arm lymphedema: We discussed that she has slight risk of lymphedema s/p sentinel lymph node biopsy with the removal of 5 lymph nodes at the time of her lumpectomy.  She does not travel often, but I recommended she get a compression sleeve to use preventatively for lymphedema.  Paper prescription given to patient today for compression sleeve, as well as a brochure for "[employer] in [place] that can help her get fitted and get the garment ordered for her.    6. Bone health:  Given [name]'s age, history of breast cancer, and her current treatment regimen including anti-estrogen therapy with letrozole, she is at risk for bone demineralization.  Her last DEXA scan was 08/21/16 and was normal.  I reviewed these results with her today. She understands she will be due for repeat DEXA scan in 2 years, while taking anti-estrogen therapy.  In the meantime, she was encouraged to increase her consumption of foods rich in calcium, as well as increase her weight-bearing activities.  She was given education on specific activities to promote bone health.  7. Cancer screening:  Due to [name]'s history and her age, she should receive screening for skin cancers, colon  cancer, and gynecologic cancers.  The information and recommendations are listed on the patient's comprehensive care plan/treatment summary and were reviewed in detail with the patient.    8. Health maintenance and wellness promotion: [name] was encouraged to consume 5-7 servings of fruits and vegetables per day. We reviewed the "Nutrition Rainbow" handout, as well as the handout "Take Control of Your Health and Reduce Your Cancer Risk" from the [place].  She was also encouraged to engage in moderate to vigorous exercise for 30 minutes per day most days of the week. We discussed the [employer] fitness program, which is designed for cancer survivors to help them become more physically fit after cancer treatments.  She was instructed to limit her alcohol consumption and continue to abstain from tobacco use.    Dispo:   -Restaging CT chest/abd/pelvis d/t unintentional weight loss; orders placed today.  -Return to cancer center to see Dr. Lindi Adie in 09/2016. -She is welcome to return back to the Survivorship Clinic at any time; no additional follow-up needed at this time.  -Consider referral back to survivorship as a long-term survivor for continued surveillance  A total of 65 minutes of face-to-face time was spent with this patient with greater than 50% of that time in counseling and care-coordination.   Mike Craze, NP Survivorship Program [place] [phone]   Note: PRIMARY CARE PROVIDER Shirline Frees, [place] [phone]

## 2016-08-25 ENCOUNTER — Other Ambulatory Visit: Payer: Self-pay | Admitting: Emergency Medicine

## 2016-08-25 DIAGNOSIS — Z170 Estrogen receptor positive status [ER+]: Principal | ICD-10-CM

## 2016-08-25 DIAGNOSIS — C50411 Malignant neoplasm of upper-outer quadrant of right female breast: Secondary | ICD-10-CM

## 2016-08-25 MED ORDER — LETROZOLE 2.5 MG PO TABS: 2.5000 mg | ORAL_TABLET | Freq: Every day | ORAL | 11 refills | Status: DC

## 2016-08-25 NOTE — Progress Notes (Signed)
Counseling intern received patient's information as referral from [employer]. Counselor called patient at approximately 12:40 pm and left voice message with introduction and invitation to set up initial counseling appointment.  Lamount Cohen, Counseling Intern Department for Spiritual Care and [facility] Supervisor - Scientist, research (physical sciences)

## 2016-08-28 ENCOUNTER — Encounter: Payer: Self-pay | Admitting: Physical Therapy

## 2016-08-31 ENCOUNTER — Telehealth: Payer: Self-pay

## 2016-08-31 NOTE — Telephone Encounter (Signed)
Pt called states that she was not able to tolerate her symptoms with letrozole. Pt states that she is literally unable to walk by the end of the day due to severe aches and pain. Pt also has hx of severe arthritis and just not able to tolerate her symptoms anymore. Pt stopped taking letrozole on Saturday 1/13th and had dramatically felt major improvement with her symptoms. Pt wants to come see Dr.Gudena sooner. Will reschedule pt feb appt for next week 1/22 at 9:15. Pt confirmed appt date and time.

## 2016-09-01 ENCOUNTER — Ambulatory Visit: Payer: Managed Care, Other (non HMO) | Admitting: Physical Therapy

## 2016-09-01 DIAGNOSIS — M25611 Stiffness of right shoulder, not elsewhere classified: Secondary | ICD-10-CM

## 2016-09-01 DIAGNOSIS — M25511 Pain in right shoulder: Secondary | ICD-10-CM

## 2016-09-01 NOTE — Therapy (Addendum)
[place], [place], [address] Phone: [phone]   Fax:  [phone]  Physical Therapy Treatment  Patient Details  Name: [name] MRN: [id number] Date of Birth: May 29, 1966 Referring Provider: Dr. Audelia Hives  Encounter Date: 09/01/2016      PT End of Session - 09/01/16 1538    Visit Number 6   Number of Visits 13   Date for PT Re-Evaluation 09/25/16   PT Start Time 2585   PT Stop Time 1342   PT Time Calculation (min) 39 min   Behavior During Therapy [facility] for tasks assessed/performed      Past Medical History:  Diagnosis Date  . Anxiety   . Breast cancer ([place])   . History of radiation therapy 04/01/16-05/13/16   right chest wall 45 Gy, right chest wall boost 10 Gy  . HTN (hypertension)    no meds now  . Psoriatic arthritis ([place])   . Sebaceous cyst    chest    Past Surgical History:  Procedure Laterality Date  . BREAST RECONSTRUCTION WITH PLACEMENT OF TISSUE EXPANDER AND FLEX HD (ACELLULAR HYDRATED DERMIS) Right 01/09/2016   Procedure: BREAST RECONSTRUCTION WITH PLACEMENT OF TISSUE EXPANDER AND FLEX HD (ACELLULAR HYDRATED DERMIS);  Surgeon: Loel Lofty Dillingham, DO;  Location: [place];  Service: Plastics;  Laterality: Right;  . MASS EXCISION N/A 05/25/2016   Procedure: EXCISION sebaceous cyst;  Surgeon: Loel Lofty Dillingham, DO;  Location: [place];  Service: Plastics;  Laterality: N/A;  . MASTOPEXY Left 02/27/2016   Procedure: LEFT BREAST MASTOPEXY;  Surgeon: Wallace Going, DO;  Location: [place];  Service: Plastics;  Laterality: Left;  . REMOVAL OF TISSUE EXPANDER AND PLACEMENT OF IMPLANT Right 02/27/2016   Procedure: REMOVAL OF RIGHT  TISSUE EXPANDERS WITH PLACEMENT OF RIGHT  BREAST IMPLANTS ;  Surgeon: Wallace Going, DO;  Location: [place];  Service: Plastics;  Laterality: Right;  . SIMPLE MASTECTOMY WITH AXILLARY SENTINEL NODE BIOPSY Right  01/09/2016   Procedure: INJECTION BLUE DYE RIGHT BREAST,  TOTAL MASTECTOMY WITH  RIGHT AXILLARY SENTINEL NODE BIOPSY;  Surgeon: Fanny Skates, MD;  Location: [place];  Service: General;  Laterality: Right;  . TONSILLECTOMY    . TOTAL ABDOMINAL HYSTERECTOMY      There were no vitals filed for this visit.      Subjective Assessment - 09/01/16 1303    Subjective "I stopped taking my second cancer pill.  I'm seeing my oncologist on Monday.  I couldn't deal with the side effects, which were just like the other one."   Currently in Pain? Yes   Pain Score 6    Pain Location Generalized   Pain Descriptors / Indicators Other (Comment);Aching  swelling, inflamed   Aggravating Factors  newer cancer pill   Pain Relieving Factors can't take anything                         [facility] Adult PT Treatment/Exercise - 09/01/16 0001      Manual Therapy   Myofascial Release right UE pulling with movement into abduction; release in opposite directions at right upper arm and left abdomen   Passive ROM in supine for right shoulder er, abduction and flexion to patient tolerance                        Long Term Clinic Goals - 09/01/16 1307  CC Long Term Goal  #4   Title right shoulder area feeling of tightness decreased by at least 50%   Baseline reports minimal improvement as of 09/01/16, but does note that she can now lie on her back   Status On-going     CC Long Term Goal  #5   Title Quick DASH score reduced to 30 or less   Status On-going            Plan - 09/01/16 1538    Clinical Impression Statement Patient seemed to tolerate stretching of the right shoulder better today.     Rehab Potential Good   Clinical Impairments Affecting Rehab Potential prior radiation over implant   PT Frequency 2x / week   PT Duration 4 weeks   PT Treatment/Interventions ADLs/Self Care Home Management;Electrical Stimulation;Therapeutic exercise;Patient/family education;Manual  techniques;Scar mobilization;Passive range of motion   PT Next Visit Plan Reassess goals.  Review HEP and progress prn. Continue P/AA/A/ROM and myofascial release to Rt. axilla/chest and left shoulder also.   Consulted and Agree with Plan of Care Patient      Patient will benefit from skilled therapeutic intervention in order to improve the following deficits and impairments:  Decreased range of motion, Impaired flexibility, Impaired UE functional use, Pain  Visit Diagnosis: Stiffness of right shoulder, not elsewhere classified  Right shoulder pain, unspecified chronicity     Problem List Patient Active Problem List   Diagnosis Date Noted  . Breast cancer of upper-outer quadrant of right female breast ([place]) 11/08/2015  . 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 Cough 12/12/2012    SALISBURY,DONNA 09/01/2016, 3:41 PM  [place], [place], [address] Phone: [phone]   Fax:  [phone]  Name: [name] MRN: [id number] Date of Birth: [birth year]   Serafina Royals, PT 09/01/16 3:41 PM  PHYSICAL THERAPY DISCHARGE SUMMARY  Visits from Start of Care: 6  Current functional level related to goals / functional outcomes: Goals were not fully met.  Pt. Was expected to continue therapy to complete the course, but did not.   Remaining deficits: Unknown   Education / Equipment: HEP  Plan: Patient agrees to discharge.  Patient goals were not met. Patient is being discharged due to not returning since the last visit.  ?????    Serafina Royals, PT 10/28/17 4:47 PM

## 2016-09-03 ENCOUNTER — Ambulatory Visit: Payer: Self-pay | Admitting: Rheumatology

## 2016-09-04 ENCOUNTER — Ambulatory Visit (INDEPENDENT_AMBULATORY_CARE_PROVIDER_SITE_OTHER): Payer: Managed Care, Other (non HMO) | Admitting: Rheumatology

## 2016-09-04 ENCOUNTER — Encounter: Payer: Self-pay | Admitting: Physical Therapy

## 2016-09-04 ENCOUNTER — Telehealth: Payer: Self-pay | Admitting: Rheumatology

## 2016-09-04 ENCOUNTER — Encounter: Payer: Self-pay | Admitting: Rheumatology

## 2016-09-04 VITALS — BP 122/57 | HR 84 | Resp 12 | Ht 66.0 in | Wt 161.0 lb

## 2016-09-04 DIAGNOSIS — Z79899 Other long term (current) drug therapy: Secondary | ICD-10-CM

## 2016-09-04 DIAGNOSIS — R5382 Chronic fatigue, unspecified: Secondary | ICD-10-CM

## 2016-09-04 DIAGNOSIS — Z853 Personal history of malignant neoplasm of breast: Secondary | ICD-10-CM | POA: Diagnosis not present

## 2016-09-04 DIAGNOSIS — L408 Other psoriasis: Secondary | ICD-10-CM | POA: Diagnosis not present

## 2016-09-04 DIAGNOSIS — L4050 Arthropathic psoriasis, unspecified: Secondary | ICD-10-CM

## 2016-09-04 LAB — CBC WITH DIFFERENTIAL/PLATELET
BASOS ABS: 53 {cells}/uL (ref 0–200)
BASOS PCT: 1 %
EOS PCT: 3 %
Eosinophils Absolute: 159 cells/uL (ref 15–500)
HCT: 39.7 % (ref 35.0–45.0)
Hemoglobin: 13.2 g/dL (ref 11.7–15.5)
LYMPHS PCT: 24 %
Lymphs Abs: 1272 cells/uL (ref 850–3900)
MCH: 30.4 pg (ref 27.0–33.0)
MCHC: 33.2 g/dL (ref 32.0–36.0)
MCV: 91.5 fL (ref 80.0–100.0)
MONOS PCT: 12 %
MPV: 11.9 fL (ref 7.5–12.5)
Monocytes Absolute: 636 cells/uL (ref 200–950)
NEUTROS ABS: 3180 {cells}/uL (ref 1500–7800)
Neutrophils Relative %: 60 %
PLATELETS: 147 10*3/uL (ref 140–400)
RBC: 4.34 MIL/uL (ref 3.80–5.10)
RDW: 14.3 % (ref 11.0–15.0)
WBC: 5.3 10*3/uL (ref 3.8–10.8)

## 2016-09-04 NOTE — Progress Notes (Signed)
Office Visit Note  Patient: [name]             Date of Birth: 30-Sep-1965           MRN: [id number]             PCP: Shirline Frees, MD Referring: Shirline Frees, MD Visit Date: 09/04/2016 Occupation: _0 @    Subjective:  No chief complaint on file. Follow-up on psoriatic arthritis and psoriasis  History of Present Illness: [name] is a 51 y.o. female  Last seen 05/07/2016. Patient has a history of right breast cancer and she is getting treatment currently from Dr. Lindi Adie. She's had to discontinue 2 different medications secondary to side effects.  She has a history of starting [place] recently approximately September 2017.  Patient feels that she is hurting all over and attributes O tesla not working well for her. She is having all joints hurting and some muscles hurting. These are also similar side effects to the chemotherapy treatments that she's recently had. Patient expected her symptoms to resolve once she finished her/changed her/stopped her chemotherapy treatment. Since they haven't totally gone away, she thinks it may be secondary to [facility]. I am suspecting that they were more related to her chemotherapy medications.  She is complaining of psoriasis vs other lesions to her scalp especially, to her left breast area at the border of her lower ribs to her left posterior back approximately T10-T11; left thigh.    Activities of Daily Living:  Patient reports morning stiffness for 60 minutes.   Patient Reports nocturnal pain.  Difficulty dressing/grooming: Reports Difficulty climbing stairs: Reports Difficulty getting out of chair: Reports Difficulty using hands for taps, buttons, cutlery, and/or writing: Reports   Review of Systems  Constitutional: Negative for fatigue.  HENT: Negative for mouth sores and mouth dryness.   Eyes: Negative for dryness.  Respiratory: Negative for shortness of breath.   Gastrointestinal: Negative for constipation and  diarrhea.  Musculoskeletal: Negative for myalgias and myalgias.  Skin: Negative for sensitivity to sunlight.  Psychiatric/Behavioral: Negative for decreased concentration and sleep disturbance.    PMFS History:  Patient Active Problem List   Diagnosis Date Noted  . Breast cancer of upper-outer quadrant of right female breast ([place]) 11/08/2015  . 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 Cough 12/12/2012    Past Medical History:  Diagnosis Date  . Anxiety   . Breast cancer ([place])   . History of radiation therapy 04/01/16-05/13/16   right chest wall 45 Gy, right chest wall boost 10 Gy  . HTN (hypertension)    no meds now  . Psoriatic arthritis ([place])   . Sebaceous cyst    chest    Family History  Problem Relation Age of Onset  . Cancer - Colon Cousin    Past Surgical History:  Procedure Laterality Date  . BREAST RECONSTRUCTION WITH PLACEMENT OF TISSUE EXPANDER AND FLEX HD (ACELLULAR HYDRATED DERMIS) Right 01/09/2016   Procedure: BREAST RECONSTRUCTION WITH PLACEMENT OF TISSUE EXPANDER AND FLEX HD (ACELLULAR HYDRATED DERMIS);  Surgeon: Loel Lofty Dillingham, DO;  Location: [place];  Service: Plastics;  Laterality: Right;  . MASS EXCISION N/A 05/25/2016   Procedure: EXCISION sebaceous cyst;  Surgeon: Loel Lofty Dillingham, DO;  Location: [place];  Service: Plastics;  Laterality: N/A;  . MASTOPEXY Left 02/27/2016   Procedure: LEFT BREAST MASTOPEXY;  Surgeon: Wallace Going, DO;  Location: [place];  Service: Plastics;  Laterality: Left;  . REMOVAL OF TISSUE EXPANDER AND  PLACEMENT OF IMPLANT Right 02/27/2016   Procedure: REMOVAL OF RIGHT  TISSUE EXPANDERS WITH PLACEMENT OF RIGHT  BREAST IMPLANTS ;  Surgeon: Wallace Going, DO;  Location: [place];  Service: Plastics;  Laterality: Right;  . SIMPLE MASTECTOMY WITH AXILLARY SENTINEL NODE BIOPSY Right 01/09/2016   Procedure: INJECTION BLUE DYE RIGHT BREAST,  TOTAL MASTECTOMY WITH  RIGHT AXILLARY SENTINEL  NODE BIOPSY;  Surgeon: Fanny Skates, MD;  Location: [place];  Service: General;  Laterality: Right;  . TONSILLECTOMY    . TOTAL ABDOMINAL HYSTERECTOMY     Social History   Social History Narrative  . No narrative on file     Objective: Vital Signs: BP (!) 122/57 (BP Location: Left Arm, Patient Position: Sitting, Cuff Size: Normal)   Pulse 84   Resp 12   Ht 5' 6" (1.676 m)   Wt 161 lb (73 kg)   BMI 25.99 kg/m    Physical Exam  Constitutional: She is oriented to person, place, and time. She appears well-developed and well-nourished.  HENT:  Head: Normocephalic and atraumatic.  Eyes: EOM are normal. Pupils are equal, round, and reactive to light.  Cardiovascular: Normal rate, regular rhythm and normal heart sounds.  Exam reveals no gallop and no friction rub.   No murmur heard. Pulmonary/Chest: Effort normal and breath sounds normal. She has no wheezes. She has no rales.  Abdominal: Soft. Bowel sounds are normal. She exhibits no distension. There is no tenderness. There is no guarding. No hernia.  Musculoskeletal: Normal range of motion. She exhibits no edema, tenderness or deformity.  Lymphadenopathy:    She has no cervical adenopathy.  Neurological: She is alert and oriented to person, place, and time. Coordination normal.  Skin: Skin is warm and dry. Capillary refill takes less than 2 seconds. No rash noted.  Psychiatric: She has a normal mood and affect. Her behavior is normal.  Nursing note and vitals reviewed.    Musculoskeletal Exam:  Full range of motion of all joints Grip strength is equal and strong bilaterally Fibromyalgia tender points are all absent  CDAI Exam: CDAI Homunculus Exam:   Joint Counts:  CDAI Tender Joint count: 0 CDAI Swollen Joint count: 0  Global Assessments:  Patient Global Assessment: 7 Provider Global Assessment: 7  CDAI Calculated Score: 14  No synovitis on examination but patient does have tenderness to the right second and third  PIP joint of the toe and thickening of the left second PIP joint of that toe.    Investigation: No additional findings.  Admission on 05/25/2016, Discharged on 05/25/2016  Component Date Value Ref Range Status  . WBC 05/25/2016 5.2  4.0 - 10.5 K/uL Final  . RBC 05/25/2016 4.04  3.87 - 5.11 MIL/uL Final  . Hemoglobin 05/25/2016 12.3  12.0 - 15.0 g/dL Final  . HCT 05/25/2016 37.7  36.0 - 46.0 % Final  . MCV 05/25/2016 93.3  78.0 - 100.0 fL Final  . MCH 05/25/2016 30.4  26.0 - 34.0 pg Final  . MCHC 05/25/2016 32.6  30.0 - 36.0 g/dL Final  . RDW 05/25/2016 14.4  11.5 - 15.5 % Final  . Platelets 05/25/2016 125* 150 - 400 K/uL Final  . Sodium 05/25/2016 139  135 - 145 mmol/L Final  . Potassium 05/25/2016 3.8  3.5 - 5.1 mmol/L Final  . Chloride 05/25/2016 108  101 - 111 mmol/L Final  . CO2 05/25/2016 26  22 - 32 mmol/L Final  . Glucose, Bld 05/25/2016 101* 65 -  99 mg/dL Final  . BUN 05/25/2016 8  6 - 20 mg/dL Final  . Creatinine, Ser 05/25/2016 0.54  0.44 - 1.00 mg/dL Final  . Calcium 05/25/2016 9.0  8.9 - 10.3 mg/dL Final  . GFR calc non Af Amer 05/25/2016 >60  >60 mL/min Final  . GFR calc Af Amer 05/25/2016 >60  >60 mL/min Final   Comment: (NOTE) The eGFR has been calculated using the CKD EPI equation. This calculation has not been validated in all clinical situations. eGFR's persistently <60 mL/min signify possible Chronic Kidney Disease.   . Anion gap 05/25/2016 5  5 - 15 Final  . Specimen Description 05/25/2016 CHEST CYSTS   Final  . Special Requests 05/25/2016 CHEST WALL CYST SPECIMEN A SWABS   Final  . Gram Stain 05/25/2016    Final                   Value:ABUNDANT WBC PRESENT, PREDOMINANTLY PMN FEW SQUAMOUS EPITHELIAL CELLS PRESENT FEW GRAM POSITIVE COCCI IN PAIRS RARE GRAM NEGATIVE COCCI IN PAIRS RARE GRAM VARIABLE ROD   . Culture 05/25/2016    Final                   Value:FEW METHICILLIN RESISTANT STAPHYLOCOCCUS AUREUS NO ANAEROBES ISOLATED   . Report Status  05/25/2016 05/30/2016 FINAL   Final  . Organism ID, Bacteria 05/25/2016 METHICILLIN RESISTANT STAPHYLOCOCCUS AUREUS   Final  Appointment on 05/18/2016  Component Date Value Ref Range Status  . [facility] 05/18/2016 50.9  mIU/mL Final   Comment:                                    Adult Female:                                      Follicular phase      3.5 -  12.5                                      Ovulation phase       4.7 -  21.5                                      Luteal phase          1.7 -   7.7                                      Postmenopausal       25.8 - 134.8      Imaging: Dg Bone Density  Result Date: 08/21/2016 EXAM: DUAL X-RAY ABSORPTIOMETRY (DXA) FOR BONE MINERAL DENSITY IMPRESSION: Referring Physician:  Nicholas Lose PATIENT: Name: Cyntha, Brickman Patient ID: [id number] Birth Date: Aug 09, 1966 Height: 66.0 in. Sex: Female Measured: 08/21/2016 Weight: 163.9 lbs. Indications: Anastrazole, Breast Cancer History, Caucasian, Estrogen Deficient, Hysterectomy, Letrozole, Postmenopausal Fractures: None Treatments: Vitamin D (E933.5) ASSESSMENT: The BMD measured at Forearm Radius 33% is 1.004 g/cm2 with a T-score of 1.3. This patient is considered normal according to [facility]) criteria. Lumbar spine was not utilized due to  advanced degenerative changes. Site Region Measured Date Measured Age YA BMD Significant CHANGE T-score Left Forearm Radius 33% 08/21/2016 52.7 1.3 1.004 g/cm2 DualFemur Neck Left 08/21/2016 52.7 1.6 1.258 g/cm2 World Health Organization [facility]) criteria for post-menopausal, Caucasian Women: Normal       T-score at or above -1 SD Osteopenia   T-score between -1 and -2.5 SD Osteoporosis T-score at or below -2.5 SD RECOMMENDATION: [place] recommends that FDA-approved medical therapies be considered in postmenopausal women and men age 22 or older with a: 1. Hip or vertebral (clinical or morphometric) fracture. 2. T-score of <-2.5 at the spine  or hip. 3. Ten-year fracture probability by FRAX of 3% or greater for hip fracture or 20% or greater for major osteoporotic fracture. All treatment decisions require clinical judgment and consideration of individual patient factors, including patient preferences, co-morbidities, previous drug use, risk factors not captured in the FRAX model (e.g. falls, vitamin D deficiency, increased bone turnover, interval significant decline in bone density) and possible under - or over-estimation of fracture risk by FRAX. All patients should ensure an adequate intake of dietary calcium (1200 mg/d) and vitamin D (800 IU daily) unless contraindicated. FOLLOW-UP: People with diagnosed cases of osteoporosis or at high risk for fracture should have regular bone mineral density tests. For patients eligible for Medicare, routine testing is allowed once every 2 years. The testing frequency can be increased to one year for patients who have rapidly progressing disease, those who are receiving or discontinuing medical therapy to restore bone mass, or have additional risk factors. I have reviewed this report, and agree with the above findings. [facility] Radiology Electronically Signed   By: Lahoma Crocker M.D.   On: 08/21/2016 09:47    Speciality Comments: No specialty comments available.    Procedures:  No procedures performed Allergies: Xanax [alprazolam] and Tape   Assessment / Plan:     Visit Diagnoses: Psoriatic arthropathy ([place])  Other psoriasis  High risk medications (not anticoagulants) long-term use - Plan: CBC with Differential/Platelet, COMPLETE METABOLIC PANEL WITH GFR, Thyroid Panel With TSH, VITAMIN D 25 Hydroxy (Vit-D Deficiency, Fractures)  Chronic fatigue - Plan: Thyroid Panel With TSH, VITAMIN D 25 Hydroxy (Vit-D Deficiency, Fractures)  History of right breast cancer - currently under treatment with Dr Lindi Adie - Plan: CBC with Differential/Platelet, COMPLETE METABOLIC PANEL WITH GFR    Plan: #1:  Continue Otezla for now #2: Dr. Estanislado Pandy spoke with the patient. She offered her Cosentyx. Patient has a history of elevated liver function and therefore cannot offer her arava or mtx. Patient will need to speak with her oncologist for consideration of Cosentyx. I will also speak with Dr. Koleen Nimrod and see if there is any contraindication since the patient has a history of right breast cancer  #3: Patient has joint pain to multiple joints. I suspect the pain is coming from osteoarthritis versus her chemotherapy treatments but certainly Otexla could potentially be responsible but unlikely based on our previous history of Otezla with other patients.  #4: Patient psoriasis is poorly control with Otezla. Patient was advised to use clobetasol ointment to the scalp. As enough for now and does not need a refill.  #5: Having some fatigue at this time. As a result we will do CBC with Diff, CMP with GFR, TSH with thyroid panel, vitamin D today.  #6: Ultrasound of bilateral feet as soon as possible to rule out active synovitis.  #7: Handout and consent already given to patient on COSENTYX . Be used  until we learn from her oncologist that it is appropriate for her to be taking this medication. Does have a history of right breast cancer for which she is under treatment currently. She was on Enbrel but we had to stop the Enbrel. Note that it was working well.  #8: Return to clinic in 2 months   Orders: Orders Placed This Encounter  Procedures  . CBC with Differential/Platelet  . COMPLETE METABOLIC PANEL WITH GFR  . Thyroid Panel With TSH  . VITAMIN D 25 Hydroxy (Vit-D Deficiency, Fractures)   No orders of the defined types were placed in this encounter.   Face-to-face time spent with patient was 40 minutes. 50% of time was spent in counseling and coordination of care.  Follow-Up Instructions: Return in about 3 months (around 12/03/2016) for PsA, Ps, Otezla ?failure, multi joint pain.   Eliezer Lofts, PA-C  Note - This record has been created using [employer].  Chart creation errors have been sought, but may not always  have been located. Such creation errors do not reflect on  the standard of medical care.

## 2016-09-04 NOTE — Progress Notes (Signed)
I have discussed Cosentyx dosing and lab plan with patient/ she has taken home the information and the consent form. She states she will let us know if she wants to proceed with the Cosentyx. Mr [name] will update her labs today. Prior to new start we need to make sure the patient has returned the consent form I provided for her in the office today.

## 2016-09-04 NOTE — Patient Instructions (Signed)
Secukinumab injection What is this medicine? SECUKINUMAB (sek ue KIN ue mab) is used to treat psoriasis. It is also used to treat psoriatic arthritis and ankylosing spondylitis. COMMON BRAND NAME(S): Cosentyx What should I tell my health care provider before I take this medicine? They need to know if you have any of these conditions: -Crohn's disease, ulcerative colitis, or other inflammatory bowel disease -infection or history of infection -other conditions affecting the immune system -recently received or are scheduled to receive a vaccine -tuberculosis, a positive skin test for tuberculosis, or have recently been in close contact with someone who has tuberculosis -an unusual or allergic reaction to secukinumab, other medicines, latex, rubber, foods, dyes, or preservatives -pregnant or trying to get pregnant -breast-feeding How should I use this medicine? This medicine is for injection under the skin. It may be administered by a healthcare professional in a hospital or clinic setting or at home. If you get this medicine at home, you will be taught how to prepare and give this medicine. Use exactly as directed. Take your medicine at regular intervals. Do not take your medicine more often than directed. It is important that you put your used needles and syringes in a special sharps container. Do not put them in a trash can. If you do not have a sharps container, call your pharmacist or healthcare provider to get one. A special MedGuide will be given to you by the pharmacist with each prescription and refill. Be sure to read this information carefully each time. Talk to your pediatrician regarding the use of this medicine in children. Special care may be needed. What if I miss a dose? It is important not to miss your dose. Call your doctor of health care professional if you are unable to keep an appointment. If you give yourself the medicine and you miss a dose, take it as soon as you can. If it  is almost time for your next dose, take only that dose. Do not take double or extra doses. What may interact with this medicine? Do not take this medicine with any of the following medications: -live virus vaccines This medicine may also interact with the following medications: -cyclosporine -inactivated vaccines -warfarin What should I watch for while using this medicine? Tell your doctor or healthcare professional if your symptoms do not start to get better or if they get worse. You will be tested for tuberculosis (TB) before you start this medicine. If your doctor prescribes any medicine for TB, you should start taking the TB medicine before starting this medicine. Make sure to finish the full course of TB medicine. Call your doctor or healthcare professional for advice if you get a fever, chills or sore throat, or other symptoms of a cold or flu. Do not treat yourself. This drug decreases your body's ability to fight infections. Try to avoid being around people who are sick. This medicine can decrease the response to a vaccine. If you need to get vaccinated, tell your healthcare professional if you have received this medicine within the last 6 months. Extra booster doses may be needed. Talk to your doctor to see if a different vaccination schedule is needed. What side effects may I notice from receiving this medicine? Side effects that you should report to your doctor or health care professional as soon as possible: -allergic reactions like skin rash, itching or hives, swelling of the face, lips, or tongue -signs and symptoms of infection like fever or chills; cough; sore throat; pain or  trouble passing urine Side effects that usually do not require medical attention (report to your doctor or health care professional if they continue or are bothersome): -diarrhea Where should I keep my medicine? Keep out of the reach of children. Store the prefilled syringe or injection pen in a refrigerator  between 2 to 8 degrees C (36 to 46 degrees F). Keep the syringe or the pen in the original carton until ready for use. Protect from light. Do not freeze. Do not shake. Prior to use, remove the syringe or pen from the refrigerator and use within 1 hour. Throw away any unused medicine after the expiration date on the label.  2017 Elsevier/Gold Standard (2015-09-05 11:48:31)

## 2016-09-04 NOTE — Telephone Encounter (Signed)
Called patient to schedule [place] of bilateral feet. She said she didn't think Dr. Keturah Barre thought it was really necessary. I have scheduled for 01/24 @ 3:30. Please let me know if I need to call her and cancel

## 2016-09-05 LAB — COMPLETE METABOLIC PANEL WITH GFR
ALT: 35 U/L — ABNORMAL HIGH (ref 6–29)
AST: 60 U/L — ABNORMAL HIGH (ref 10–35)
Albumin: 3.6 g/dL (ref 3.6–5.1)
Alkaline Phosphatase: 105 U/L (ref 33–130)
BILIRUBIN TOTAL: 1 mg/dL (ref 0.2–1.2)
BUN: 11 mg/dL (ref 7–25)
CHLORIDE: 106 mmol/L (ref 98–110)
CO2: 28 mmol/L (ref 20–31)
Calcium: 9.6 mg/dL (ref 8.6–10.4)
Creat: 0.55 mg/dL (ref 0.50–1.05)
GFR, Est African American: >89 mL/min (ref >=60)
GLUCOSE: 102 mg/dL — AB (ref 65–99)
Potassium: 4.4 mmol/L (ref 3.5–5.3)
SODIUM: 141 mmol/L (ref 135–146)
TOTAL PROTEIN: 6.7 g/dL (ref 6.1–8.1)

## 2016-09-05 LAB — THYROID PANEL WITH TSH
FREE THYROXINE INDEX: 2.5 (ref 1.4–3.8)
T3 Uptake: 27 % (ref 22–35)
T4, Total: 9.3 ug/dL (ref 4.5–12.0)
TSH: 0.81 m[IU]/L

## 2016-09-05 LAB — VITAMIN D 25 HYDROXY (VIT D DEFICIENCY, FRACTURES): VIT D 25 HYDROXY: 43 ng/mL (ref 30–100)

## 2016-09-06 NOTE — Assessment & Plan Note (Signed)
Right mastectomy 01/09/2016: IDC, 2 tumors, 3.2 cm and 2.2 cm, 2/3 sentinel nodes positive, focal extranodal extension, 2 additional nodes negative, ER 100%, PR 90%, HER-2 negative ratio 1.16, Ki-67 10% and 7%, T2(multifocal) N1 a stage IIB Mammaprint: Luminal type A, low risk Adjuvant radiation therapy 04/01/2016 to 05/12/2016   Current Treatment: Anastrozole started 05/18/16 Discontinued 07/22/2016 (Musculoskeletal side eff); Letrozole started Jan 1st 2018  Letrozole Toxicities:  RTC in 3 months for follow up.

## 2016-09-07 ENCOUNTER — Encounter: Payer: Self-pay | Admitting: Physical Therapy

## 2016-09-07 ENCOUNTER — Encounter: Payer: Self-pay | Admitting: Hematology and Oncology

## 2016-09-07 ENCOUNTER — Ambulatory Visit (HOSPITAL_BASED_OUTPATIENT_CLINIC_OR_DEPARTMENT_OTHER): Payer: Managed Care, Other (non HMO) | Admitting: Hematology and Oncology

## 2016-09-07 ENCOUNTER — Telehealth: Payer: Self-pay | Admitting: Pharmacist

## 2016-09-07 DIAGNOSIS — C50411 Malignant neoplasm of upper-outer quadrant of right female breast: Secondary | ICD-10-CM

## 2016-09-07 DIAGNOSIS — Z170 Estrogen receptor positive status [ER+]: Secondary | ICD-10-CM

## 2016-09-07 DIAGNOSIS — Z79811 Long term (current) use of aromatase inhibitors: Secondary | ICD-10-CM

## 2016-09-07 MED ORDER — TAMOXIFEN CITRATE 20 MG PO TABS: 20.0000 mg | ORAL_TABLET | Freq: Every day | ORAL | 0 refills | Status: DC

## 2016-09-07 NOTE — Telephone Encounter (Signed)
I was asked to research whether Cosentyx is an option for this patient.  She is currently on [place] and has failed [place].  She is unable to take methotrexate or leflunomide due to history of liver function problems.  She has a recent diagnosis of breast cancer.  She completed radition approximately 3-4 months ago, and is currently receiving tamoxifen.    I reached out to the Cosentyx medical science liaison regarding the use of Cosentyx in patient's with breast cancer.  He reported patients were not included in Cosentyx clinical studies unless they had been treated for breast cancer for at least 5 years prior to starting Cosentyx.  Therefore, Cosentyx has not been studied in patients with recent history of breast cancer.

## 2016-09-07 NOTE — Progress Notes (Signed)
Counselor emailed patient to introduce counseling services as follow-up to previous voicemail message on 08/25/2016.  Lamount Cohen, Counseling Intern Department for Spiritual Care and [facility] Supervisor - Scientist, research (physical sciences)

## 2016-09-07 NOTE — Progress Notes (Signed)
Patient Care Team: Shirline Frees, MD as PCP - General (Family Medicine) Bo Merino, MD as Referring Physician (Rheumatology) Sylvan Cheese, NP as Nurse Practitioner (Hematology and Oncology)  DIAGNOSIS:  Encounter Diagnosis  Name Primary?  . Malignant neoplasm of upper-outer quadrant of right breast in female, estrogen receptor positive ([place])     SUMMARY OF ONCOLOGIC HISTORY:   Breast cancer of upper-outer quadrant of right female breast ([place])   11/07/2015 Initial Diagnosis    Right breast biopsy 12:30 position: Invasive ductal carcinoma grade 2, ER/PR positive, HER-2 negative ratio 1.17, Ki-67 7%, ultrasound revealed 2.7 cm mass, T2 N0 stage II a clinical stage      01/09/2016 Surgery    Right mastectomy [facility]): IDC, 2 tumors, 3.2 cm and 2.2 cm, 2/3 sentinel nodes positive, focal extranodal extension, 2 additional nodes negative, ER 100%, PR 90%, HER-2 negative ratio 1.16, Ki-67 10% and 7%, T2(multifocal) N1 a stage IIB      01/09/2016 Miscellaneous    Mammaprint: Luminal type A, low risk (did not need chemotherapy)      04/01/2016 - 05/13/2016 Radiation Therapy    Adjuvant radiation therapy ([facility]). Right chest wall - 4 Field: 45 Gy in 25 fractions.   Right chest wall scar boost: 10 Gy in 5 fractions.      05/18/2016 - 07/22/2016 Anti-estrogen oral therapy    Anastrozole 1 mg daily; discontinued on 07/22/16 d/t side effects.       08/17/2016 - 08/29/2016 Anti-estrogen oral therapy    Letrozole (start with 1.25 mg daily, then increase to full 2.5 mg daily dose). Musculoskeletal pains led to discontinuation      09/07/2016 -  Anti-estrogen oral therapy    Tamoxifen 10 mg daily to be increased to 20 mg if she tolerates       CHIEF COMPLIANT: Follow-up on letrozole therapy  INTERVAL HISTORY: [name] is a 51 year old with above-mentioned history of right breast cancer treated with mastectomy followed by adjuvant radiation. She has had profound difficulty  with anastrozole and we discontinued it. She has been on letrozole since January 2018 initially started at a low dose and then to be increased to full dose. Even with the half dose, she was unable to tolerate letrozole. She diffuse muscular skeletal aches and pains she discontinued it. She is here accompanied by her husband to discuss the next steps in the treatment plan. She is also struggling with psoriatic arthritis as well as osteoarthritis. There is a proposal to treat her with Cosentryx. She also has elevated liver function tests related to fatty liver. Patient had an abnormality in the breast and underwent ultrasound guided biopsy which revealed PASH  REVIEW OF SYSTEMS:   Constitutional: Denies fevers, chills or abnormal weight loss Eyes: Denies blurriness of vision Ears, nose, mouth, throat, and face: Denies mucositis or sore throat Respiratory: Denies cough, dyspnea or wheezes Cardiovascular: Denies palpitation, chest discomfort Gastrointestinal:  Denies nausea, heartburn or change in bowel habits Skin: Denies abnormal skin rashes Lymphatics: Denies new lymphadenopathy or easy bruising Neurological:Denies numbness, tingling or new weaknesses Behavioral/Psych: Mood is stable, no new changes  Extremities: Musculoskeletal aches and pains Breast:  denies any pain or lumps or nodules in either breasts All other systems were reviewed with the patient and are negative.  I have reviewed the past medical history, past surgical history, social history and family history with the patient and they are unchanged from previous note.  ALLERGIES:  is allergic to xanax [alprazolam] and tape.  MEDICATIONS:  Current Outpatient Prescriptions  Medication Sig Dispense Refill  . Apremilast 30 MG TABS Take 1 tablet by mouth 2 (two) times daily. 60 tablet 2  . citalopram (CELEXA) 20 MG tablet Take 1 tablet (20 mg total) by mouth daily. (Patient not taking: Reported on 09/04/2016) 30 tablet 6  . Ginger,  Zingiber officinalis, (GINGER EXTRACT PO) Take by mouth.    Marland Kitchen LORazepam (ATIVAN) 1 MG tablet Take 1 mg by mouth every 6 (six) hours as needed for anxiety.     . Misc Natural Products (TART CHERRY ADVANCED PO) Take by mouth.    . Multiple Vitamins-Minerals (MULTIVITAMIN WITH MINERALS) tablet Take 1 tablet by mouth daily.    . Omega-3 Fatty Acids (FISH OIL PO) Take by mouth.    . Saccharomyces boulardii (PROBIOTIC) 250 MG CAPS Take 250 mg by mouth daily.     . tamoxifen (NOLVADEX) 20 MG tablet Take 1 tablet (20 mg total) by mouth daily. 30 tablet 0  . TURMERIC PO Take by mouth.    . Wound Dressings (SONAFINE EX) Apply topically.     No current facility-administered medications for this visit.     PHYSICAL EXAMINATION: ECOG PERFORMANCE STATUS: 1 - Symptomatic but completely ambulatory  Vitals:   09/07/16 0924  BP: 127/61  Pulse: 69  Resp: 17  Temp: 97.6 F (36.4 C)   Filed Weights   09/07/16 0924  Weight: 162 lb (73.5 kg)    GENERAL:alert, no distress and comfortable SKIN: skin color, texture, turgor are normal, no rashes or significant lesions EYES: normal, Conjunctiva are pink and non-injected, sclera clear OROPHARYNX:no exudate, no erythema and lips, buccal mucosa, and tongue normal  NECK: supple, thyroid normal size, non-tender, without nodularity LYMPH:  no palpable lymphadenopathy in the cervical, axillary or inguinal LUNGS: clear to auscultation and percussion with normal breathing effort HEART: regular rate & rhythm and no murmurs and no lower extremity edema ABDOMEN:abdomen soft, non-tender and normal bowel sounds MUSCULOSKELETAL:no cyanosis of digits and no clubbing  NEURO: alert & oriented x 3 with fluent speech, no focal motor/sensory deficits EXTREMITIES: No lower extremity edema, Muscle skeletal aches and pains much improved after stopping letrozole  LABORATORY DATA:  I have reviewed the data as listed   Chemistry      Component Value Date/Time   NA 141  09/04/2016 1344   NA 138 11/21/2015 1433   K 4.4 09/04/2016 1344   K 3.9 11/21/2015 1433   CL 106 09/04/2016 1344   CO2 28 09/04/2016 1344   CO2 26 11/21/2015 1433   BUN 11 09/04/2016 1344   BUN 9.9 11/21/2015 1433   CREATININE 0.55 09/04/2016 1344   CREATININE 0.7 11/21/2015 1433      Component Value Date/Time   CALCIUM 9.6 09/04/2016 1344   CALCIUM 9.3 11/21/2015 1433   ALKPHOS 105 09/04/2016 1344   ALKPHOS 95 11/21/2015 1433   AST 60 (H) 09/04/2016 1344   AST 114 (H) 11/21/2015 1433   ALT 35 (H) 09/04/2016 1344   ALT 68 (H) 11/21/2015 1433   BILITOT 1.0 09/04/2016 1344   BILITOT 1.47 (H) 11/21/2015 1433       Lab Results  Component Value Date   WBC 5.3 09/04/2016   HGB 13.2 09/04/2016   HCT 39.7 09/04/2016   MCV 91.5 09/04/2016   PLT 147 09/04/2016   NEUTROABS 3,180 09/04/2016    ASSESSMENT & PLAN:  Breast cancer of upper-outer quadrant of right female breast ([place]) Right mastectomy 01/09/2016: IDC, 2 tumors, 3.2 cm  and 2.2 cm, 2/3 sentinel nodes positive, focal extranodal extension, 2 additional nodes negative, ER 100%, PR 90%, HER-2 negative ratio 1.16, Ki-67 10% and 7%, T2(multifocal) N1 a stage IIB Mammaprint: Luminal type A, low risk Adjuvant radiation therapy 04/01/2016 to 05/12/2016   Current Treatment: Anastrozole started 05/18/16 Discontinued 07/22/2016 (Musculoskeletal side eff); Letrozole started Jan 1st 2018 Stopped Aug 29 2016; started tamoxifen 10 mg daily 09/08/2016 Letrozole Toxicities: Severe musculoskeletal aches and pains even at half dose Recommendation: Start tamoxifen 10 mg daily 09/08/2006 Tamoxifen counseling:We discussed the risks and benefits of tamoxifen. These include but not limited to insomnia, hot flashes, mood changes, vaginal dryness, and weight gain. Although rare, serious side effects including risk of blood clots were also discussed. We strongly believe that the benefits far outweigh the risks. Patient understands these risks and  consented to starting treatment. Planned treatment duration is 5 years.  Bone density done on 08/21/2016: T score less 1.3 normal  Psoriasis: I discussed with the patient that she is not immunocompromised from antiestrogen therapy standpoint. I reviewed the interaction was between tamoxifen and Cosentryx. There does not appear to be any such interactions. If patient would benefit from Cosentryx, she should be able to receive it. We will monitor tamoxifen-related toxicities closely.  Elevated LFTs: Patient has had elevation of LFTs for several years. I reviewed her liver function tests and I feel that this is her new baseline. Profound weight loss of 70 pounds: Is as general lifestyle modifications and patient feeling ill previously. I discussed the role of whole body scans and we determined that it was unnecessary at this point. If she continues to lose weight at the same pace and remained need to obtain scans in the future.  RTC in 3 months for follow up or sooner if necessary.   I spent 25 minutes talking to the patient of which more than half was spent in counseling and coordination of care.  No orders of the defined types were placed in this encounter.  The patient has a good understanding of the overall plan. she agrees with it. she will call with any problems that may develop before the next visit here.   Rulon Eisenmenger, MD 09/07/16

## 2016-09-08 ENCOUNTER — Telehealth: Payer: Self-pay | Admitting: Rheumatology

## 2016-09-08 NOTE — Telephone Encounter (Signed)
It appears that she would not be able to use Cosyntex for 5 years after her treatment for breast cancer. We have very limited choices. Due to her elevated LFTs methotrexate, Arava and most likely sulfasalazine are not possible options. I would like to refer her to a tertiary care center for advice. I left message for patient on her answering machine to call us back.

## 2016-09-08 NOTE — Telephone Encounter (Addendum)
From your note I understand that her myalgias could be related to the aromatase inhibitors. And is stopping that may help her myalgias.  The question regarding treatment of her psoriatic arthritis and psoriasis is still unclear. The literature indicates that she cannot use Cosyntex until after 5 years of breast cancer diagnosis. Are you in agreement with that? I I have referred her to a tertiary care center for treatment choices. Thank you  Abel Presto  ----- Message from Nicholas Lose, MD sent at 09/08/2016  4:56 PM EST ----- Regarding: RE: Alexander Bergeron Dr.Elwin Tsou, Thanks for the message. I explained to her that I do not have patients on Cosentyx and anti estrogen therapy. I reviewed the literature and there was no interaction between these drugs. She is quite miserable on aromataseinhibitors and so I discontinued them and am starting her on tamoxifen. Hope her myalgias get better soon Thanks Vinay ----- Message ----- From: Bo Merino, MD Sent: 09/08/2016   9:29 AM To: Nicholas Lose, MD Subject: Alexander Bergeron                                    She is currently on [place] and has failed [place].  She is unable to take methotrexate or leflunomide due to history of liver function problems. All anti-TNF second contraindicated for the next 5 years. As Cosyntex did not have any warning for cancer I initially thought that would be an option. After doing some further research I found that patients were not included in Cosentyx clinical studies unless they had been treated for breast cancer for at least 5 years prior to starting Cosentyx.  Therefore, Cosentyx has not been studied in patients with recent history of breast cancer.   Patient  came to the office. She is very disappointed as her disease is flaring and there is no treatment available. She said that she did not feel that Cosyntex is contraindicated in her treatment. At this point I'm trying to refer her to a tertiary care center for second  opinion. Please let me  know if you have any experience with Cosyntex and newly diagnosed breast cancer patients. Thank you  [place]

## 2016-09-08 NOTE — Telephone Encounter (Signed)
I left a message on the answering machine for patient to call me back. Which she came to the office instead. We had detailed discussion regarding her current situation. She is quite disappointed that she cannot do started Cosyntex. I did discuss that him there are no studies bear Cosyntex has been used in already diagnosed breast cancer patients. I would like to get a second opinion regarding that and would refer her to a tertiary care center. She is quite as pointed and frustrated about her current situation. I cannot use anti-TNF's for 5 years area . Due to her elevated LFTs methotrexate, Arava and sulfasalazine could not be used either. She stated that her oncologist do not see any contraindication for Cosyntex. I will write date noted to her oncologist as well. I'll try to make a referral for her as soon as possible.

## 2016-09-10 ENCOUNTER — Other Ambulatory Visit: Payer: Self-pay | Admitting: Adult Health

## 2016-09-10 DIAGNOSIS — C50411 Malignant neoplasm of upper-outer quadrant of right female breast: Secondary | ICD-10-CM

## 2016-09-10 DIAGNOSIS — R634 Abnormal weight loss: Secondary | ICD-10-CM

## 2016-09-10 DIAGNOSIS — Z170 Estrogen receptor positive status [ER+]: Secondary | ICD-10-CM

## 2016-09-11 ENCOUNTER — Ambulatory Visit: Payer: Managed Care, Other (non HMO) | Admitting: Physical Therapy

## 2016-09-14 ENCOUNTER — Telehealth: Payer: Self-pay | Admitting: Radiology

## 2016-09-14 ENCOUNTER — Other Ambulatory Visit (INDEPENDENT_AMBULATORY_CARE_PROVIDER_SITE_OTHER): Payer: Self-pay | Admitting: *Deleted

## 2016-09-14 DIAGNOSIS — L4050 Arthropathic psoriasis, unspecified: Secondary | ICD-10-CM

## 2016-09-14 NOTE — Telephone Encounter (Signed)
Dr. Fredonia [place] from [facility] has returned our calls concerning referral for patient. I have provided clinical information regarding patient. She states she will make sure the referral is expedited. She will have her staff call patient tomorrow and make appointment within the next 2-3 weeks. To you FYI

## 2016-09-14 NOTE — Telephone Encounter (Signed)
I called patient, [facility] will try to schedule patient within 1-2 weeks, [facility] [facility] has received referral - Dr. Trudie Reed is reviewing.

## 2016-09-15 NOTE — Telephone Encounter (Signed)
[facility] called and declined rheumatology referral - no reason given. I called and informed patient. [facility] referral still pending.

## 2016-09-16 ENCOUNTER — Ambulatory Visit: Payer: Managed Care, Other (non HMO) | Admitting: Physical Therapy

## 2016-09-18 ENCOUNTER — Telehealth: Payer: Self-pay | Admitting: Hematology and Oncology

## 2016-09-18 NOTE — Telephone Encounter (Signed)
Faxed records to [place]

## 2016-09-21 ENCOUNTER — Ambulatory Visit: Payer: Managed Care, Other (non HMO) | Admitting: Hematology and Oncology

## 2016-09-21 ENCOUNTER — Ambulatory Visit: Payer: Managed Care, Other (non HMO) | Admitting: Physical Therapy

## 2016-10-04 ENCOUNTER — Other Ambulatory Visit: Payer: Self-pay | Admitting: Hematology and Oncology

## 2016-10-05 ENCOUNTER — Telehealth: Payer: Self-pay | Admitting: *Deleted

## 2016-10-05 ENCOUNTER — Other Ambulatory Visit: Payer: Self-pay

## 2016-10-05 MED ORDER — TAMOXIFEN CITRATE 20 MG PO TABS: 20.0000 mg | ORAL_TABLET | Freq: Every day | ORAL | 0 refills | Status: DC

## 2016-10-05 NOTE — Telephone Encounter (Signed)
Patient called wanting to make MD Gudena aware that she will be starting Cosentyx within the week. Patient is currently seeing a new rheumatologist (MD Cecille Po).

## 2016-10-05 NOTE — Telephone Encounter (Signed)
RN called patient and left voicemail

## 2016-10-05 NOTE — Telephone Encounter (Signed)
Per Dr.Gudena note from last visit, no interaction with cosentyx and ok to proceed with medication.

## 2016-10-06 NOTE — Progress Notes (Unsigned)
Counseling intern left patient a voicemail message offering counseling services as follow-up to previous voicemail and email.  Lamount Cohen, Counseling Intern Department for Spiritual Care and [facility] Supervisor - Scientist, research (physical sciences)

## 2016-10-07 ENCOUNTER — Other Ambulatory Visit: Payer: Self-pay | Admitting: Hematology and Oncology

## 2016-10-20 ENCOUNTER — Other Ambulatory Visit: Payer: Self-pay | Admitting: Rheumatology

## 2016-10-20 NOTE — Telephone Encounter (Signed)
Last Visit: 09/04/16 Next Visit: 12/03/16 Labs: 09/04/16 AST 60 ALT 35  Okay to refill [place]?

## 2016-10-25 ENCOUNTER — Other Ambulatory Visit: Payer: Self-pay | Admitting: Hematology and Oncology

## 2016-10-26 ENCOUNTER — Telehealth: Payer: Self-pay

## 2016-10-26 MED ORDER — TAMOXIFEN CITRATE 20 MG PO TABS: 20.0000 mg | ORAL_TABLET | Freq: Every day | ORAL | 0 refills | Status: DC

## 2016-10-26 NOTE — Telephone Encounter (Signed)
Pt called asking when she should increase her tamoxifen from 1/2 tab to a full tab. Per Dr Lindi Adie OV note this should be done when she knows she is tolerating it. She started in January and is tolerating it OK. Instructed her to increase to a full tab and call us if she does not tolerate the increase. Did tamoxifen refill per pt request.

## 2016-11-02 ENCOUNTER — Telehealth: Payer: Self-pay

## 2016-11-02 NOTE — Telephone Encounter (Signed)
LVM regarding pt request on compression sleeve script for her air travel next weekend. Left call back number. There is a script that we can give her for the sleeve is she needs it.

## 2016-11-10 ENCOUNTER — Other Ambulatory Visit: Payer: Self-pay | Admitting: Hematology and Oncology

## 2016-11-10 DIAGNOSIS — Z853 Personal history of malignant neoplasm of breast: Secondary | ICD-10-CM

## 2016-11-10 DIAGNOSIS — N6320 Unspecified lump in the left breast, unspecified quadrant: Secondary | ICD-10-CM

## 2016-12-03 ENCOUNTER — Ambulatory Visit: Payer: Managed Care, Other (non HMO) | Admitting: Rheumatology

## 2016-12-06 NOTE — Assessment & Plan Note (Signed)
Right mastectomy 01/09/2016: IDC, 2 tumors, 3.2 cm and 2.2 cm, 2/3 sentinel nodes positive, focal extranodal extension, 2 additional nodes negative, ER 100%, PR 90%, HER-2 negative ratio 1.16, Ki-67 10% and 7%, T2(multifocal) N1 a stage IIB Mammaprint: Luminal type A, low risk Adjuvant radiation therapy 04/01/2016 to 05/12/2016   Current Treatment: Anastrozole started 05/18/16 Discontinued 07/22/2016 (Musculoskeletal side eff); Letrozole started Jan 1st 2018 Stopped Aug 29 2016; started tamoxifen 10 mg daily 09/08/2016  Tamoxifen Toxicities:  Psoriasis Elevated LFTs: Weight Loss: 70 Lbs  RTC in 6 months

## 2016-12-07 ENCOUNTER — Ambulatory Visit (HOSPITAL_BASED_OUTPATIENT_CLINIC_OR_DEPARTMENT_OTHER): Payer: 59 | Admitting: Hematology and Oncology

## 2016-12-07 ENCOUNTER — Encounter: Payer: Self-pay | Admitting: Hematology and Oncology

## 2016-12-07 DIAGNOSIS — R634 Abnormal weight loss: Secondary | ICD-10-CM | POA: Diagnosis not present

## 2016-12-07 DIAGNOSIS — Z170 Estrogen receptor positive status [ER+]: Secondary | ICD-10-CM | POA: Diagnosis not present

## 2016-12-07 DIAGNOSIS — Z79811 Long term (current) use of aromatase inhibitors: Secondary | ICD-10-CM

## 2016-12-07 DIAGNOSIS — F329 Major depressive disorder, single episode, unspecified: Secondary | ICD-10-CM

## 2016-12-07 DIAGNOSIS — C50411 Malignant neoplasm of upper-outer quadrant of right female breast: Secondary | ICD-10-CM

## 2016-12-07 MED ORDER — VENLAFAXINE HCL ER 37.5 MG PO CP24: 37.5000 mg | ORAL_CAPSULE | Freq: Every day | ORAL | 6 refills | Status: DC

## 2016-12-07 NOTE — Progress Notes (Signed)
Patient Care Team: Shirline Frees, MD as PCP - General (Family Medicine) Bo Merino, MD as Referring Physician (Rheumatology) Sylvan Cheese, NP as Nurse Practitioner (Hematology and Oncology)  DIAGNOSIS:  Encounter Diagnosis  Name Primary?  . Malignant neoplasm of upper-outer quadrant of right breast in female, estrogen receptor positive ([place])     SUMMARY OF ONCOLOGIC HISTORY:   Breast cancer of upper-outer quadrant of right female breast ([place])   11/07/2015 Initial Diagnosis    Right breast biopsy 12:30 position: Invasive ductal carcinoma grade 2, ER/PR positive, HER-2 negative ratio 1.17, Ki-67 7%, ultrasound revealed 2.7 cm mass, T2 N0 stage II a clinical stage      01/09/2016 Surgery    Right mastectomy Dalbert Batman): IDC, 2 tumors, 3.2 cm and 2.2 cm, 2/3 sentinel nodes positive, focal extranodal extension, 2 additional nodes negative, ER 100%, PR 90%, HER-2 negative ratio 1.16, Ki-67 10% and 7%, T2(multifocal) N1 a stage IIB      01/09/2016 Miscellaneous    Mammaprint: Luminal type A, low risk (did not need chemotherapy)      04/01/2016 - 05/13/2016 Radiation Therapy    Adjuvant radiation therapy ([facility]). Right chest wall - 4 Field: 45 Gy in 25 fractions.   Right chest wall scar boost: 10 Gy in 5 fractions.      05/18/2016 - 07/22/2016 Anti-estrogen oral therapy    Anastrozole 1 mg daily; discontinued on 07/22/16 d/t side effects.       08/17/2016 - 08/29/2016 Anti-estrogen oral therapy    Letrozole (start with 1.25 mg daily, then increase to full 2.5 mg daily dose). Musculoskeletal pains led to discontinuation      09/07/2016 -  Anti-estrogen oral therapy    Tamoxifen 10 mg daily to be increased to 20 mg if she tolerates       CHIEF COMPLIANT: Follow-up on tamoxifen therapy  INTERVAL HISTORY: [name] is a 51 year old with above-mentioned history of right breast cancer treated with mastectomy followed by radiation and is currently on antiestrogen therapy.  Initially she was on anastrozole which was discontinued due to side effects and she was started on letrozole. She could not tolerate that as well. She had musculoskeletal pains led to discontinuation. She is currently on tamoxifen. We started on her 10 mg daily. She increased it to full dose on 11/15/2016. She appears be tolerating this fairly well. She was started on Cosentyx by rheumatology at [facility]. Psoriasis is improved but the arthritis has not improved. She is complaining of depression today.  REVIEW OF SYSTEMS:   Constitutional: Denies fevers, chills or abnormal weight loss Eyes: Denies blurriness of vision Ears, nose, mouth, throat, and face: Denies mucositis or sore throat Respiratory: Denies cough, dyspnea or wheezes Cardiovascular: Denies palpitation, chest discomfort Gastrointestinal:  Denies nausea, heartburn or change in bowel habits Skin: Denies abnormal skin rashes Lymphatics: Denies new lymphadenopathy or easy bruising Neurological:Denies numbness, tingling or new weaknesses Behavioral/Psych: Mood is stable, no new changes  Extremities: No lower extremity edema, complains of arthritis Breast:  denies any pain or lumps or nodules in either breasts All other systems were reviewed with the patient and are negative.  I have reviewed the past medical history, past surgical history, social history and family history with the patient and they are unchanged from previous note.  ALLERGIES:  is allergic to xanax [alprazolam] and tape.  MEDICATIONS:  Current Outpatient Prescriptions  Medication Sig Dispense Refill  . citalopram (CELEXA) 20 MG tablet Take 1 tablet (20 mg total) by mouth daily. (  Patient not taking: Reported on 09/04/2016) 30 tablet 6  . Ginger, Zingiber officinalis, (GINGER EXTRACT PO) Take by mouth.    Marland Kitchen LORazepam (ATIVAN) 1 MG tablet Take 1 mg by mouth every 6 (six) hours as needed for anxiety.     . Misc Natural Products (TART CHERRY ADVANCED PO) Take by mouth.    .  Multiple Vitamins-Minerals (MULTIVITAMIN WITH MINERALS) tablet Take 1 tablet by mouth daily.    . Omega-3 Fatty Acids (FISH OIL PO) Take by mouth.    . OTEZLA 30 MG TABS TAKE 1 TABLET BY MOUTH TWICE DAILY 60 tablet 1  . Saccharomyces boulardii (PROBIOTIC) 250 MG CAPS Take 250 mg by mouth daily.     . tamoxifen (NOLVADEX) 20 MG tablet TAKE ONE TABLET BY MOUTH DAILY 30 tablet 0  . tamoxifen (NOLVADEX) 20 MG tablet Take 1 tablet (20 mg total) by mouth daily. 90 tablet 0  . TURMERIC PO Take by mouth.    . venlafaxine XR (EFFEXOR-XR) 37.5 MG 24 hr capsule Take 1 capsule (37.5 mg total) by mouth daily with breakfast. 30 capsule 6  . Wound Dressings (SONAFINE EX) Apply topically.     No current facility-administered medications for this visit.     PHYSICAL EXAMINATION: ECOG PERFORMANCE STATUS: 1 - Symptomatic but completely ambulatory  Vitals:   12/07/16 0931  BP: (!) 123/52  Pulse: 63  Resp: 18  Temp: 97.7 F (36.5 C)   Filed Weights   12/07/16 0931  Weight: 164 lb 11.2 oz (74.7 kg)    GENERAL:alert, no distress and comfortable SKIN: skin color, texture, turgor are normal, no rashes or significant lesions EYES: normal, Conjunctiva are pink and non-injected, sclera clear OROPHARYNX:no exudate, no erythema and lips, buccal mucosa, and tongue normal  NECK: supple, thyroid normal size, non-tender, without nodularity LYMPH:  no palpable lymphadenopathy in the cervical, axillary or inguinal LUNGS: clear to auscultation and percussion with normal breathing effort HEART: regular rate & rhythm and no murmurs and no lower extremity edema ABDOMEN:abdomen soft, non-tender and normal bowel sounds MUSCULOSKELETAL:no cyanosis of digits and no clubbing  NEURO: alert & oriented x 3 with fluent speech, no focal motor/sensory deficits EXTREMITIES: No lower extremity edema BREAST: No palpable masses or nodules in either right or left breasts. No palpable axillary supraclavicular or infraclavicular  adenopathy no breast tenderness or nipple discharge. (exam performed in the presence of a chaperone)  LABORATORY DATA:  I have reviewed the data as listed   Chemistry      Component Value Date/Time   NA 141 09/04/2016 1344   NA 138 11/21/2015 1433   K 4.4 09/04/2016 1344   K 3.9 11/21/2015 1433   CL 106 09/04/2016 1344   CO2 28 09/04/2016 1344   CO2 26 11/21/2015 1433   BUN 11 09/04/2016 1344   BUN 9.9 11/21/2015 1433   CREATININE 0.55 09/04/2016 1344   CREATININE 0.7 11/21/2015 1433      Component Value Date/Time   CALCIUM 9.6 09/04/2016 1344   CALCIUM 9.3 11/21/2015 1433   ALKPHOS 105 09/04/2016 1344   ALKPHOS 95 11/21/2015 1433   AST 60 (H) 09/04/2016 1344   AST 114 (H) 11/21/2015 1433   ALT 35 (H) 09/04/2016 1344   ALT 68 (H) 11/21/2015 1433   BILITOT 1.0 09/04/2016 1344   BILITOT 1.47 (H) 11/21/2015 1433       Lab Results  Component Value Date   WBC 5.3 09/04/2016   HGB 13.2 09/04/2016   HCT 39.7 09/04/2016  MCV 91.5 09/04/2016   PLT 147 09/04/2016   NEUTROABS 3,180 09/04/2016    ASSESSMENT & PLAN:  Breast cancer of upper-outer quadrant of right female breast ([place]) Right mastectomy 01/09/2016: IDC, 2 tumors, 3.2 cm and 2.2 cm, 2/3 sentinel nodes positive, focal extranodal extension, 2 additional nodes negative, ER 100%, PR 90%, HER-2 negative ratio 1.16, Ki-67 10% and 7%, T2(multifocal) N1 a stage IIB Mammaprint: Luminal type A, low risk Adjuvant radiation therapy 04/01/2016 to 05/12/2016   Current Treatment: Anastrozole started 05/18/16 Discontinued 07/22/2016 (Musculoskeletal side eff); Letrozole started Jan 1st 2018 Stopped Aug 29 2016; started tamoxifen 10 mg daily 09/08/2016  Tamoxifen Toxicities: Tolerating it fairly well. Denies any hot flashes. She does have a heat sensation from below the waist level.  Depression: I recommended and is sent a prescription for Effexor XR. She was given information on that drug. She will read about it and decide if  she wants to take it. Psoriasis Elevated LFTs: Weight Loss: 70 Lbs  RTC in 6 months   I spent 25 minutes talking to the patient of which more than half was spent in counseling and coordination of care.  No orders of the defined types were placed in this encounter.  The patient has a good understanding of the overall plan. she agrees with it. she will call with any problems that may develop before the next visit here.   Rulon Eisenmenger, MD 12/07/16

## 2016-12-08 ENCOUNTER — Encounter: Payer: Self-pay | Admitting: Hematology and Oncology

## 2016-12-25 ENCOUNTER — Telehealth: Payer: Self-pay

## 2016-12-25 ENCOUNTER — Encounter: Payer: Self-pay | Admitting: Hematology and Oncology

## 2016-12-29 NOTE — Telephone Encounter (Signed)
error 

## 2017-01-13 ENCOUNTER — Other Ambulatory Visit: Payer: Self-pay | Admitting: Gastroenterology

## 2017-01-13 DIAGNOSIS — K7469 Other cirrhosis of liver: Secondary | ICD-10-CM

## 2017-01-18 ENCOUNTER — Ambulatory Visit
Admission: RE | Admit: 2017-01-18 | Discharge: 2017-01-18 | Disposition: A | Discharge status: Home / Self Care | Payer: 59 | Source: Ambulatory Visit | Attending: Adult Health | Admitting: Adult Health

## 2017-01-18 DIAGNOSIS — C50411 Malignant neoplasm of upper-outer quadrant of right female breast: Secondary | ICD-10-CM

## 2017-01-18 DIAGNOSIS — N6092 Unspecified benign mammary dysplasia of left breast: Secondary | ICD-10-CM

## 2017-01-18 DIAGNOSIS — Z170 Estrogen receptor positive status [ER+]: Principal | ICD-10-CM

## 2017-02-05 ENCOUNTER — Ambulatory Visit
Admission: RE | Admit: 2017-02-05 | Discharge: 2017-02-05 | Disposition: A | Discharge status: Home / Self Care | Payer: 59 | Source: Ambulatory Visit | Attending: Gastroenterology | Admitting: Gastroenterology

## 2017-02-05 DIAGNOSIS — K7469 Other cirrhosis of liver: Secondary | ICD-10-CM

## 2017-02-18 ENCOUNTER — Other Ambulatory Visit: Payer: Self-pay | Admitting: Hematology and Oncology

## 2017-02-18 ENCOUNTER — Other Ambulatory Visit: Payer: Self-pay

## 2017-06-08 ENCOUNTER — Ambulatory Visit: Payer: 59 | Admitting: Hematology and Oncology

## 2017-06-08 ENCOUNTER — Telehealth: Payer: Self-pay

## 2017-06-08 NOTE — Telephone Encounter (Signed)
Called pt this morning to make sure that she got this RN's vm yesterday regarding rescheduling her appt for today, due to being out of office all day. Pt states that she was going to be on her way to the cancer center for her appt. Pt states that she did not listen to vm yesterday and was not aware that there was a reschedule that needed to be made. Apologized to pt for the inconvenience and told pt that we would be able to reschedule her this week if she would like for the same time. Pt agreed and rescheduled for 06/10/17 at [place] to have pt see Lindsey,NP but pt would really prefer to see Dr.Gudena at this time. Pt verbalized confirmed time/date of her new appt. No further concerns or questions at this time.

## 2017-06-08 NOTE — Telephone Encounter (Signed)
Called patient with upcoming appointment.per 10/23 los

## 2017-06-10 ENCOUNTER — Telehealth: Payer: Self-pay | Admitting: Hematology and Oncology

## 2017-06-10 ENCOUNTER — Ambulatory Visit (HOSPITAL_BASED_OUTPATIENT_CLINIC_OR_DEPARTMENT_OTHER): Payer: 59 | Admitting: Hematology and Oncology

## 2017-06-10 DIAGNOSIS — F329 Major depressive disorder, single episode, unspecified: Secondary | ICD-10-CM

## 2017-06-10 DIAGNOSIS — R634 Abnormal weight loss: Secondary | ICD-10-CM

## 2017-06-10 DIAGNOSIS — C50411 Malignant neoplasm of upper-outer quadrant of right female breast: Secondary | ICD-10-CM | POA: Diagnosis not present

## 2017-06-10 DIAGNOSIS — G4700 Insomnia, unspecified: Secondary | ICD-10-CM

## 2017-06-10 DIAGNOSIS — Z79811 Long term (current) use of aromatase inhibitors: Secondary | ICD-10-CM

## 2017-06-10 DIAGNOSIS — Z170 Estrogen receptor positive status [ER+]: Secondary | ICD-10-CM

## 2017-06-10 MED ORDER — SECUKINUMAB 150 MG/ML ~~LOC~~ SOSY: 300.0000 mg | PREFILLED_SYRINGE | SUBCUTANEOUS | Status: AC

## 2017-06-10 NOTE — Assessment & Plan Note (Signed)
Right mastectomy 01/09/2016: IDC, 2 tumors, 3.2 cm and 2.2 cm, 2/3 sentinel nodes positive, focal extranodal extension, 2 additional nodes negative, ER 100%, PR 90%, HER-2 negative ratio 1.16, Ki-67 10% and 7%, T2(multifocal) N1 a stage IIB Mammaprint: Luminal type A, low risk Adjuvant radiation therapy 04/01/2016 to 05/12/2016   Current Treatment: Anastrozole started 05/18/16 Discontinued 07/22/2016 (Musculoskeletal side eff); Letrozole started Jan 1st 2018 Stopped Aug 29 2016; started tamoxifen 10 mg daily 09/08/2016  Tamoxifen Toxicities: Tolerating it fairly well. Denies any hot flashes. She does have a heat sensation from below the waist level.  Depression: She did not want to take Effexor XR.  Psoriasis Elevated LFTs: Weight Loss: 70 Lbs  RTC in 1 year

## 2017-06-10 NOTE — Telephone Encounter (Signed)
Scheduled patient per 10/25 los. Patient declined AVS and calendar of upcoming October 2019 appointments.

## 2017-06-10 NOTE — Progress Notes (Signed)
Patient Care Team: Shirline Frees, MD as PCP - General (Family Medicine) Bo Merino, MD as Referring Physician (Rheumatology) Sylvan Cheese, NP as Nurse Practitioner (Hematology and Oncology)  DIAGNOSIS:  Encounter Diagnosis  Name Primary?  . Malignant neoplasm of upper-outer quadrant of right breast in female, estrogen receptor positive ([place])     SUMMARY OF ONCOLOGIC HISTORY:   Breast cancer of upper-outer quadrant of right female breast ([place])   11/07/2015 Initial Diagnosis    Right breast biopsy 12:30 position: Invasive ductal carcinoma grade 2, ER/PR positive, HER-2 negative ratio 1.17, Ki-67 7%, ultrasound revealed 2.7 cm mass, T2 N0 stage II a clinical stage      01/09/2016 Surgery    Right mastectomy Dal[facility] Batman): IDC, 2 tumors, 3.2 cm and 2.2 cm, 2/3 sentinel nodes positive, focal extranodal extension, 2 additional nodes negative, ER 100%, PR 90%, HER-2 negative ratio 1.16, Ki-67 10% and 7%, T2(multifocal) N1 a stage IIB      01/09/2016 Miscellaneous    Mammaprint: Luminal type A, low risk (did not need chemotherapy)      04/01/2016 - 05/13/2016 Radiation Therapy    Adjuvant radiation therapy ([facility]). Right chest wall - 4 Field: 45 Gy in 25 fractions.   Right chest wall scar boost: 10 Gy in 5 fractions.      05/18/2016 - 07/22/2016 Anti-estrogen oral therapy    Anastrozole 1 mg daily; discontinued on 07/22/16 d/t side effects.       08/17/2016 - 08/29/2016 Anti-estrogen oral therapy    Letrozole (start with 1.25 mg daily, then increase to full 2.5 mg daily dose). Musculoskeletal pains led to discontinuation      09/07/2016 -  Anti-estrogen oral therapy    Tamoxifen 10 mg daily to be increased to 20 mg if she tolerates       CHIEF COMPLIANT: Follow-up on tamoxifen therapy  INTERVAL HISTORY: [name] is a 51 year old with above-mentioned history of right breast cancer currently on antiestrogen therapy.  She could not tolerate anastrozole or letrozole.   She appears to be tolerating tamoxifen fairly well.  She currently has hot flashes as well as muscle aches and pains.  She also has underlying depression for which he tried to venlafaxine and could not tolerated.  She has profound difficulty going to sleep.  She denies any lumps or nodules in the breast.  She feels tight in the right reconstructed breast.  REVIEW OF SYSTEMS:   Constitutional: Denies fevers, chills or abnormal weight loss Eyes: Denies blurriness of vision Ears, nose, mouth, throat, and face: Denies mucositis or sore throat Respiratory: Denies cough, dyspnea or wheezes Cardiovascular: Denies palpitation, chest discomfort Gastrointestinal:  Denies nausea, heartburn or change in bowel habits Skin: Denies abnormal skin rashes Lymphatics: Denies new lymphadenopathy or easy bruising Neurological:Denies numbness, tingling or new weaknesses, complaining of difficulty going to sleep Behavioral/Psych: Mild underlying depression Extremities: No lower extremity edema Breast: Tightness in the right reconstructed breast All other systems were reviewed with the patient and are negative.  I have reviewed the past medical history, past surgical history, social history and family history with the patient and they are unchanged from previous note.  ALLERGIES:  is allergic to xanax [alprazolam] and tape.  MEDICATIONS:  Current Outpatient Prescriptions  Medication Sig Dispense Refill  . Ginger, Zingiber officinalis, (GINGER EXTRACT PO) Take by mouth.    Marland Kitchen LORazepam (ATIVAN) 1 MG tablet Take 1 mg by mouth every 6 (six) hours as needed for anxiety.     . Misc Natural  Products (TART CHERRY ADVANCED PO) Take by mouth.    . Multiple Vitamins-Minerals (MULTIVITAMIN WITH MINERALS) tablet Take 1 tablet by mouth daily.    . Omega-3 Fatty Acids (FISH OIL PO) Take by mouth.    . Saccharomyces boulardii (PROBIOTIC) 250 MG CAPS Take 250 mg by mouth daily.     . Secukinumab (COSENTYX 300 DOSE) 150 MG/ML  SOSY Inject 300 mg into the skin every 28 (twenty-eight) days.    . tamoxifen (NOLVADEX) 20 MG tablet TAKE ONE TABLET BY MOUTH DAILY 90 tablet 2  . TURMERIC PO Take by mouth.    . Wound Dressings (SONAFINE EX) Apply topically.     No current facility-administered medications for this visit.     PHYSICAL EXAMINATION: ECOG PERFORMANCE STATUS: 1 - Symptomatic but completely ambulatory  Vitals:   06/10/17 0900  BP: (!) 120/59  Pulse: 66  Resp: 18  Temp: 98.2 F (36.8 C)  SpO2: 100%   Filed Weights   06/10/17 0900  Weight: 171 lb (77.6 kg)    GENERAL:alert, no distress and comfortable SKIN: skin color, texture, turgor are normal, no rashes or significant lesions EYES: normal, Conjunctiva are pink and non-injected, sclera clear OROPHARYNX:no exudate, no erythema and lips, buccal mucosa, and tongue normal  NECK: supple, thyroid normal size, non-tender, without nodularity LYMPH:  no palpable lymphadenopathy in the cervical, axillary or inguinal LUNGS: clear to auscultation and percussion with normal breathing effort HEART: regular rate & rhythm and no murmurs and no lower extremity edema ABDOMEN:abdomen soft, non-tender and normal bowel sounds MUSCULOSKELETAL:no cyanosis of digits and no clubbing  NEURO: alert & oriented x 3 with fluent speech, no focal motor/sensory deficits EXTREMITIES: No lower extremity edema BREAST: No palpable masses or nodules in either right or left breasts. No palpable axillary supraclavicular or infraclavicular adenopathy no breast tenderness or nipple discharge. (exam performed in the presence of a chaperone)  LABORATORY DATA:  I have reviewed the data as listed   Chemistry      Component Value Date/Time   NA 141 09/04/2016 1344   NA 138 11/21/2015 1433   K 4.4 09/04/2016 1344   K 3.9 11/21/2015 1433   CL 106 09/04/2016 1344   CO2 28 09/04/2016 1344   CO2 26 11/21/2015 1433   BUN 11 09/04/2016 1344   BUN 9.9 11/21/2015 1433   CREATININE 0.55  09/04/2016 1344   CREATININE 0.7 11/21/2015 1433      Component Value Date/Time   CALCIUM 9.6 09/04/2016 1344   CALCIUM 9.3 11/21/2015 1433   ALKPHOS 105 09/04/2016 1344   ALKPHOS 95 11/21/2015 1433   AST 60 (H) 09/04/2016 1344   AST 114 (H) 11/21/2015 1433   ALT 35 (H) 09/04/2016 1344   ALT 68 (H) 11/21/2015 1433   BILITOT 1.0 09/04/2016 1344   BILITOT 1.47 (H) 11/21/2015 1433       Lab Results  Component Value Date   WBC 5.3 09/04/2016   HGB 13.2 09/04/2016   HCT 39.7 09/04/2016   MCV 91.5 09/04/2016   PLT 147 09/04/2016   NEUTROABS 3,180 09/04/2016    ASSESSMENT & PLAN:  Breast cancer of upper-outer quadrant of right female breast ([place]) Right mastectomy 01/09/2016: IDC, 2 tumors, 3.2 cm and 2.2 cm, 2/3 sentinel nodes positive, focal extranodal extension, 2 additional nodes negative, ER 100%, PR 90%, HER-2 negative ratio 1.16, Ki-67 10% and 7%, T2(multifocal) N1 a stage IIB Mammaprint: Luminal type A, low risk Adjuvant radiation therapy 04/01/2016 to 05/12/2016   Current  Treatment: Anastrozole started 05/18/16 Discontinued 07/22/2016 (Musculoskeletal side eff); Letrozole started Jan 1st 2018 Stopped Aug 29 2016; started tamoxifen 10 mg daily 09/08/2016  Tamoxifen Toxicities: Tolerating it fairly well. Denies any hot flashes. She does have a heat sensation from below the waist level.  Depression: She could not tolerate Effexor Psoriasis Weight Loss: 70 Lbs Insomnia: I encouraged the patient not to take lorazepam during the daytime and only take it at bedtime to see if it works out.  RTC in 1 year   I spent 25 minutes talking to the patient of which more than half was spent in counseling and coordination of care.  No orders of the defined types were placed in this encounter.  The patient has a good understanding of the overall plan. she agrees with it. she will call with any problems that may develop before the next visit here.   Rulon Eisenmenger,  MD 06/10/17

## 2017-08-03 ENCOUNTER — Other Ambulatory Visit: Payer: Self-pay | Admitting: Gastroenterology

## 2017-08-03 DIAGNOSIS — K7460 Unspecified cirrhosis of liver: Secondary | ICD-10-CM

## 2017-08-20 ENCOUNTER — Ambulatory Visit
Admission: RE | Admit: 2017-08-20 | Discharge: 2017-08-20 | Disposition: A | Discharge status: Home / Self Care | Payer: 59 | Source: Ambulatory Visit | Attending: Gastroenterology | Admitting: Gastroenterology

## 2017-08-20 DIAGNOSIS — K7460 Unspecified cirrhosis of liver: Secondary | ICD-10-CM

## 2017-08-23 ENCOUNTER — Other Ambulatory Visit: Payer: Self-pay | Admitting: Gastroenterology

## 2017-08-23 DIAGNOSIS — R935 Abnormal findings on diagnostic imaging of other abdominal regions, including retroperitoneum: Secondary | ICD-10-CM

## 2017-08-28 ENCOUNTER — Ambulatory Visit
Admission: RE | Admit: 2017-08-28 | Discharge: 2017-08-28 | Disposition: A | Discharge status: Home / Self Care | Payer: 59 | Source: Ambulatory Visit | Attending: Gastroenterology | Admitting: Gastroenterology

## 2017-08-28 ENCOUNTER — Other Ambulatory Visit: Payer: Self-pay | Admitting: Gastroenterology

## 2017-08-28 DIAGNOSIS — R935 Abnormal findings on diagnostic imaging of other abdominal regions, including retroperitoneum: Secondary | ICD-10-CM

## 2017-08-28 MED ORDER — GADOBENATE DIMEGLUMINE 529 MG/ML IV SOLN: 15.0000 mL | Freq: Once | INTRAVENOUS | Status: DC | PRN

## 2017-08-28 MED ORDER — GADOBENATE DIMEGLUMINE 529 MG/ML IV SOLN
15.0000 mL | Freq: Once | INTRAVENOUS | Status: DC | PRN
Start: 2017-08-28 — End: 2017-08-29

## 2017-08-30 ENCOUNTER — Encounter: Payer: Self-pay | Admitting: Hematology and Oncology

## 2017-08-30 ENCOUNTER — Ambulatory Visit
Admission: RE | Admit: 2017-08-30 | Discharge: 2017-08-30 | Disposition: A | Discharge status: Home / Self Care | Payer: 59 | Source: Ambulatory Visit | Attending: Gastroenterology | Admitting: Gastroenterology

## 2017-08-30 DIAGNOSIS — R935 Abnormal findings on diagnostic imaging of other abdominal regions, including retroperitoneum: Secondary | ICD-10-CM

## 2017-08-30 MED ORDER — GADOBENATE DIMEGLUMINE 529 MG/ML IV SOLN
15.0000 mL | Freq: Once | INTRAVENOUS | Status: AC | PRN
  Administered 2017-08-30: 15 mL via INTRAVENOUS

## 2017-08-31 ENCOUNTER — Encounter: Payer: Self-pay | Admitting: Hematology and Oncology

## 2017-09-01 ENCOUNTER — Inpatient Hospital Stay: Payer: 59 | Attending: Hematology and Oncology

## 2017-09-01 ENCOUNTER — Other Ambulatory Visit: Payer: Self-pay

## 2017-09-01 ENCOUNTER — Telehealth: Payer: Self-pay | Admitting: Hematology and Oncology

## 2017-09-01 DIAGNOSIS — Z170 Estrogen receptor positive status [ER+]: Principal | ICD-10-CM

## 2017-09-01 DIAGNOSIS — C50411 Malignant neoplasm of upper-outer quadrant of right female breast: Secondary | ICD-10-CM

## 2017-09-01 NOTE — Telephone Encounter (Signed)
Spoke to patient regarding upcoming April appointments.

## 2017-09-02 LAB — AFP TUMOR MARKER: AFP, SERUM, TUMOR MARKER: 15.8 ng/mL — AB (ref 0.0–8.3)

## 2017-09-20 ENCOUNTER — Telehealth: Payer: Self-pay

## 2017-09-20 NOTE — Telephone Encounter (Signed)
Received VM from patient regarding she wants to let us know she has upcoming dental work scheduled and wants to make sure if dentist orders antibiotics, nitrous oxide, and Ibuprofen, that this will be ok to take since she has a history of breast cancer.  Returned pt's call and explained since she is undergoing any chemo treatment, then those short term medications for the dental work should be ok.  Pt then said, she has cirrhosis and would Ibuprofen be ok? Encouraged her to contact the physician's office that diagnosed her cirrhosis to discuss meds like Ibuprofen, pt said she would do that.

## 2017-10-12 ENCOUNTER — Encounter: Payer: Self-pay | Admitting: Hematology and Oncology

## 2017-10-13 ENCOUNTER — Other Ambulatory Visit: Payer: Self-pay | Admitting: Gastroenterology

## 2017-10-13 DIAGNOSIS — K769 Liver disease, unspecified: Secondary | ICD-10-CM

## 2017-10-18 ENCOUNTER — Telehealth: Payer: Self-pay

## 2017-10-18 ENCOUNTER — Inpatient Hospital Stay: Payer: 59 | Attending: Hematology and Oncology | Admitting: Adult Health

## 2017-10-18 ENCOUNTER — Encounter: Payer: Self-pay | Admitting: Adult Health

## 2017-10-18 ENCOUNTER — Other Ambulatory Visit: Payer: Self-pay

## 2017-10-18 DIAGNOSIS — Z923 Personal history of irradiation: Secondary | ICD-10-CM | POA: Insufficient documentation

## 2017-10-18 DIAGNOSIS — G629 Polyneuropathy, unspecified: Secondary | ICD-10-CM | POA: Diagnosis not present

## 2017-10-18 DIAGNOSIS — Z170 Estrogen receptor positive status [ER+]: Secondary | ICD-10-CM | POA: Diagnosis not present

## 2017-10-18 DIAGNOSIS — Z79811 Long term (current) use of aromatase inhibitors: Secondary | ICD-10-CM | POA: Insufficient documentation

## 2017-10-18 DIAGNOSIS — C50411 Malignant neoplasm of upper-outer quadrant of right female breast: Secondary | ICD-10-CM

## 2017-10-18 DIAGNOSIS — N644 Mastodynia: Secondary | ICD-10-CM | POA: Diagnosis not present

## 2017-10-18 MED ORDER — GABAPENTIN 100 MG PO CAPS: 100.0000 mg | ORAL_CAPSULE | Freq: Every day | ORAL | 2 refills | Status: AC

## 2017-10-18 MED ORDER — GABAPENTIN 100 MG PO CAPS: 100.0000 mg | ORAL_CAPSULE | Freq: Every day | ORAL | 2 refills | Status: DC

## 2017-10-18 NOTE — Progress Notes (Signed)
[facility] Follow up:    Shirline Frees, MD [place] [id number]   DIAGNOSIS: Cancer Staging Breast cancer of upper-outer quadrant of right female breast [facility]) Staging form: Breast, AJCC 7th Edition - Clinical stage from 11/13/2015: Stage IIA (T2, N0, M0) - Unsigned Staging comments: Staged at breast conference on 3.29.17 - Pathologic stage from 01/09/2016: Stage IIB (T2(m), N1a, cM0) - Signed by Holley Bouche, NP on 08/19/2016   SUMMARY OF ONCOLOGIC HISTORY:   Breast cancer of upper-outer quadrant of right female breast ([place])   11/07/2015 Initial Diagnosis    Right breast biopsy 12:30 position: Invasive ductal carcinoma grade 2, ER/PR positive, HER-2 negative ratio 1.17, Ki-67 7%, ultrasound revealed 2.7 cm mass, T2 N0 stage II a clinical stage      01/09/2016 Surgery    Right mastectomy Dalbert Batman): IDC, 2 tumors, 3.2 cm and 2.2 cm, 2/3 sentinel nodes positive, focal extranodal extension, 2 additional nodes negative, ER 100%, PR 90%, HER-2 negative ratio 1.16, Ki-67 10% and 7%, T2(multifocal) N1 a stage IIB      01/09/2016 Miscellaneous    Mammaprint: Luminal type A, low risk (did not need chemotherapy)      04/01/2016 - 05/13/2016 Radiation Therapy    Adjuvant radiation therapy ([facility]). Right chest wall - 4 Field: 45 Gy in 25 fractions.   Right chest wall scar boost: 10 Gy in 5 fractions.      05/18/2016 - 07/22/2016 Anti-estrogen oral therapy    Anastrozole 1 mg daily; discontinued on 07/22/16 d/t side effects.       08/17/2016 - 08/29/2016 Anti-estrogen oral therapy    Letrozole (start with 1.25 mg daily, then increase to full 2.5 mg daily dose). Musculoskeletal pains led to discontinuation      09/07/2016 -  Anti-estrogen oral therapy    Tamoxifen 10 mg daily to be increased to 20 mg if she tolerates       CURRENT THERAPY: Tamoxifen  INTERVAL HISTORY: Seng Fouts 52 y.o. female returns for evaluation of her h/o breast  cancer.  She is doing well today.  She is taking Tamoxifen 70m daily.  She denies hot flashes.  She has h/o psoriatic arthritis.  She has chronic joint aches and pains.  She is concerned due to some pain at her mastectomy site.  She notes an increase in intermittent discomfort to her right mastectomy site over the past few months.  This pain isn't improving, she doesn't note it worsening.  She says its hot and sometimes warm.  In Epic she has a MRI of the liver that demonstrates cirrhosis, and two tiny sub cm lesions in the left hepatic lobe.     Patient Active Problem List   Diagnosis Date Noted  . Breast cancer of upper[place]outer quadrant of right female breast ([place] 11/08/2015  . 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 Cough 12/12/2012    is allergic to xanax [alprazolam] and tape.  MEDICAL HISTORY: Past Medical History:  Diagnosis Date  . Anxiety   . Breast cancer ([place]   . History of radiation therapy 04/01/16-05/13/16   right chest wall 45 Gy, right chest wall boost 10 Gy  . HTN (hypertension)    no meds now  . Psoriatic arthritis ([place]   . Sebaceous cyst    chest    SURGICAL HISTORY: Past Surgical History:  Procedure Laterality Date  . BREAST RECONSTRUCTION WITH PLACEMENT OF TISSUE EXPANDER AND FLEX HD (ACELLULAR HYDRATED DERMIS) Right  01/09/2016   Procedure: BREAST RECONSTRUCTION WITH PLACEMENT OF TISSUE EXPANDER AND FLEX HD (ACELLULAR HYDRATED DERMIS);  Surgeon: Loel Lofty Dillingham, DO;  Location: [facility];  Service: Plastics;  Laterality: Right;  . MASS EXCISION N/A 05/25/2016   Procedure: EXCISION sebaceous cyst;  Surgeon: Loel Lofty Dillingham, DO;  Location: [facility];  Service: Plastics;  Laterality: N/A;  . MASTECTOMY     right mast 2017  . MASTOPEXY Left 02/27/2016   Procedure: LEFT BREAST MASTOPEXY;  Surgeon: Wallace Going, DO;  Location: [facility];  Service: Plastics;  Laterality: Left;  . REDUCTION MAMMAPLASTY    . REMOVAL OF TISSUE EXPANDER AND  PLACEMENT OF IMPLANT Right 02/27/2016   Procedure: REMOVAL OF RIGHT  TISSUE EXPANDERS WITH PLACEMENT OF RIGHT  BREAST IMPLANTS ;  Surgeon: Wallace Going, DO;  Location: [facility];  Service: Plastics;  Laterality: Right;  . SIMPLE MASTECTOMY WITH AXILLARY SENTINEL NODE BIOPSY Right 01/09/2016   Procedure: INJECTION BLUE DYE RIGHT BREAST,  TOTAL MASTECTOMY WITH  RIGHT AXILLARY SENTINEL NODE BIOPSY;  Surgeon: Fanny Skates, MD;  Location: [facility];  Service: General;  Laterality: Right;  . TONSILLECTOMY    . TOTAL ABDOMINAL HYSTERECTOMY      SOCIAL HISTORY: Social History   Socioeconomic History  . Marital status: Married    Spouse name: Not on file  . Number of children: 1  . Years of education: Not on file  . Highest education level: Not on file  Social Needs  . Financial resource strain: Not on file  . Food insecurity - worry: Not on file  . Food insecurity - inability: Not on file  . Transportation needs - medical: Not on file  . Transportation needs - non-medical: Not on file  Occupational History  . Occupation: none  Tobacco Use  . Smoking status: Never Smoker  . Smokeless tobacco: Never Used  Substance and Sexual Activity  . Alcohol use: No    Alcohol/week: 0.0 oz    Comment: stopped in March 2017  . Drug use: No  . Sexual activity: Yes    Birth control/protection: Surgical  Other Topics Concern  . Not on file  Social History Narrative  . Not on file    FAMILY HISTORY: Family History  Problem Relation Age of Onset  . Cancer - Colon Cousin     Review of Systems  Constitutional: Negative for appetite change, chills, fatigue, fever and unexpected weight change.  HENT:   Negative for hearing loss and lump/mass.   Eyes: Negative for eye problems and icterus.  Respiratory: Negative for chest tightness, cough and shortness of breath.   Cardiovascular: Negative for chest pain, leg swelling and palpitations.  Gastrointestinal: Negative for abdominal  distention, abdominal pain, constipation, diarrhea, nausea and vomiting.  Endocrine: Negative for hot flashes.  Musculoskeletal: Negative for arthralgias.  Skin: Negative for itching and rash.  Hematological: Negative for adenopathy. Does not bruise/bleed easily.  Psychiatric/Behavioral: Negative for depression. The patient is not nervous/anxious.       PHYSICAL EXAMINATION  ECOG PERFORMANCE STATUS: 1 - Symptomatic but completely ambulatory  Vitals:   10/18/17 0847  BP: 136/63  Pulse: 66  Resp: 18  Temp: 97.8 F (36.6 C)  SpO2: 100%  Exam performed by [facility] Physical Exam  LABORATORY DATA:  CBC    Component Value Date/Time   WBC 5.3 09/04/2016 1344   RBC 4.34 09/04/2016 1344   HGB 13.2 09/04/2016 1344   HGB 14.3 11/21/2015 1433  HCT 39.7 09/04/2016 1344   HCT 43.1 11/21/2015 1433   PLT 147 09/04/2016 1344   PLT 114 (L) 11/21/2015 1433   MCV 91.5 09/04/2016 1344   MCV 97.9 11/21/2015 1433   MCH 30.4 09/04/2016 1344   MCHC 33.2 09/04/2016 1344   RDW 14.3 09/04/2016 1344   RDW 13.3 11/21/2015 1433   LYMPHSABS 1,272 09/04/2016 1344   LYMPHSABS 1.6 11/21/2015 1433   MONOABS 636 09/04/2016 1344   MONOABS 1.0 (H) 11/21/2015 1433   EOSABS 159 09/04/2016 1344   EOSABS 0.1 11/21/2015 1433   BASOSABS 53 09/04/2016 1344   BASOSABS 0.0 11/21/2015 1433    CMP     Component Value Date/Time   NA 141 09/04/2016 1344   NA 138 11/21/2015 1433   K 4.4 09/04/2016 1344   K 3.9 11/21/2015 1433   CL 106 09/04/2016 1344   CO2 28 09/04/2016 1344   CO2 26 11/21/2015 1433   GLUCOSE 102 (H) 09/04/2016 1344   GLUCOSE 115 11/21/2015 1433   BUN 11 09/04/2016 1344   BUN 9.9 11/21/2015 1433   CREATININE 0.55 09/04/2016 1344   CREATININE 0.7 11/21/2015 1433   CALCIUM 9.6 09/04/2016 1344   CALCIUM 9.3 11/21/2015 1433   PROT 6.7 09/04/2016 1344   PROT 7.9 11/21/2015 1433   ALBUMIN 3.6 09/04/2016 1344   ALBUMIN 3.7 11/21/2015 1433   AST 60 (H) 09/04/2016 1344   AST 114 (H)  11/21/2015 1433   ALT 35 (H) 09/04/2016 1344   ALT 68 (H) 11/21/2015 1433   ALKPHOS 105 09/04/2016 1344   ALKPHOS 95 11/21/2015 1433   BILITOT 1.0 09/04/2016 1344   BILITOT 1.47 (H) 11/21/2015 1433   GFRNONAA >89 09/04/2016 1344   GFRAA >89 09/04/2016 1344          ASSESSMENT and  PLAN:   Breast cancer of upper-outer quadrant of right female breast ([place]) Right mastectomy 01/09/2016: IDC, 2 tumors, 3.2 cm and 2.2 cm, 2/3 sentinel nodes positive, focal extranodal extension, 2 additional nodes negative, ER 100%, PR 90%, HER-2 negative ratio 1.16, Ki-67 10% and 7%, T2(multifocal) N1 a stage IIB Mammaprint: Luminal type A, low risk Adjuvant radiation therapy 04/01/2016 to 05/12/2016   Current Treatment: Anastrozole started 05/18/16 Discontinued 07/22/2016 (Musculoskeletal side eff); Letrozole started Jan 1st 2018 Stopped Aug 29 2016; started tamoxifen 10 mg daily 09/08/2016, has increased to 30m daily  Tamoxifen Toxicities: Tolerating it fairly well. Denies any hot flashes. She does have a heat sensation from below the waist level.  Right breast pain: patient is having pain at her previous mastectomy and implant site.  This pain is more neuropathic in origin.  She met with myself and Dr. GLindi Adieto review her plan.  She will start Gabapentin 1056mpo qhs.  She was given detailed info about this in her AVS.  She was also recommended to f/u with Dr. DiMarla Roebout this discomfort to determine possible solutions.    [place] return in October, for her routine f/u with Dr. GuLindi Adie She will be due for mammogram in 01/2018, which she knows needs to be scheduled.       All questions were answered. The patient knows to call the clinic with any problems, questions or concerns. We can certainly see the patient much sooner if necessary.  A total of (30) minutes of face-to-face time was spent with this patient with greater than 50% of that time in counseling and  care-coordination.  This note was electronically signed. LiScot Dock  NP 10/18/2017   Attending Note  I personally saw and examined Alexander Bergeron. The plan of care was discussed with her. I agree with the assessment and plan as documented above. Breast exam: Does not demonstrate any lumps or nodules.  She is very tender to touch.  I encouraged her to see her plastic surgeon.  We also recommended starting gabapentin at bedtime.  She was not very sure whether she wants to take another medication.  I provided her with literature and the paper prescription so that she can decide what she wants to do.  Signed Harriette Ohara, MD

## 2017-10-18 NOTE — Patient Instructions (Signed)
Gabapentin capsules or tablets What is this medicine? GABAPENTIN (GA ba pen tin) is used to control partial seizures in adults with epilepsy. It is also used to treat certain types of nerve pain. This medicine may be used for other purposes; ask your health care provider or pharmacist if you have questions. COMMON BRAND NAME(S): Active-PAC with Gabapentin, Gabarone, Neurontin What should I tell my health care provider before I take this medicine? They need to know if you have any of these conditions: -kidney disease -suicidal thoughts, plans, or attempt; a previous suicide attempt by you or a family member -an unusual or allergic reaction to gabapentin, other medicines, foods, dyes, or preservatives -pregnant or trying to get pregnant -breast-feeding How should I use this medicine? Take this medicine by mouth with a glass of water. Follow the directions on the prescription label. You can take it with or without food. If it upsets your stomach, take it with food.Take your medicine at regular intervals. Do not take it more often than directed. Do not stop taking except on your doctor's advice. If you are directed to break the 600 or 800 mg tablets in half as part of your dose, the extra half tablet should be used for the next dose. If you have not used the extra half tablet within 28 days, it should be thrown away. A special MedGuide will be given to you by the pharmacist with each prescription and refill. Be sure to read this information carefully each time. Talk to your pediatrician regarding the use of this medicine in children. Special care may be needed. Overdosage: If you think you have taken too much of this medicine contact a poison control center or emergency room at once. NOTE: This medicine is only for you. Do not share this medicine with others. What if I miss a dose? If you miss a dose, take it as soon as you can. If it is almost time for your next dose, take only that dose. Do not  take double or extra doses. What may interact with this medicine? Do not take this medicine with any of the following medications: -other gabapentin products This medicine may also interact with the following medications: -alcohol -antacids -antihistamines for allergy, cough and cold -certain medicines for anxiety or sleep -certain medicines for depression or psychotic disturbances -homatropine; hydrocodone -naproxen -narcotic medicines (opiates) for pain -phenothiazines like chlorpromazine, mesoridazine, prochlorperazine, thioridazine This list may not describe all possible interactions. Give your health care provider a list of all the medicines, herbs, non-prescription drugs, or dietary supplements you use. Also tell them if you smoke, drink alcohol, or use illegal drugs. Some items may interact with your medicine. What should I watch for while using this medicine? Visit your doctor or health care professional for regular checks on your progress. You may want to keep a record at home of how you feel your condition is responding to treatment. You may want to share this information with your doctor or health care professional at each visit. You should contact your doctor or health care professional if your seizures get worse or if you have any new types of seizures. Do not stop taking this medicine or any of your seizure medicines unless instructed by your doctor or health care professional. Stopping your medicine suddenly can increase your seizures or their severity. Wear a medical identification bracelet or chain if you are taking this medicine for seizures, and carry a card that lists all your medications. You may get drowsy, dizzy,   or have blurred vision. Do not drive, use machinery, or do anything that needs mental alertness until you know how this medicine affects you. To reduce dizzy or fainting spells, do not sit or stand up quickly, especially if you are an older patient. Alcohol can  increase drowsiness and dizziness. Avoid alcoholic drinks. Your mouth may get dry. Chewing sugarless gum or sucking hard candy, and drinking plenty of water will help. The use of this medicine may increase the chance of suicidal thoughts or actions. Pay special attention to how you are responding while on this medicine. Any worsening of mood, or thoughts of suicide or dying should be reported to your health care professional right away. Women who become pregnant while using this medicine may enroll in the [place] Antiepileptic Drug Pregnancy Registry by calling 1-888-233-2334. This registry collects information about the safety of antiepileptic drug use during pregnancy. What side effects may I notice from receiving this medicine? Side effects that you should report to your doctor or health care professional as soon as possible: -allergic reactions like skin rash, itching or hives, swelling of the face, lips, or tongue -worsening of mood, thoughts or actions of suicide or dying Side effects that usually do not require medical attention (report to your doctor or health care professional if they continue or are bothersome): -constipation -difficulty walking or controlling muscle movements -dizziness -nausea -slurred speech -tiredness -tremors -weight gain This list may not describe all possible side effects. Call your doctor for medical advice about side effects. You may report side effects to FDA at 1-800-FDA-1088. Where should I keep my medicine? Keep out of reach of children. This medicine may cause accidental overdose and death if it taken by other adults, children, or pets. Mix any unused medicine with a substance like cat litter or coffee grounds. Then throw the medicine away in a sealed container like a sealed bag or a coffee can with a lid. Do not use the medicine after the expiration date. Store at room temperature between 15 and 30 degrees C (59 and 86 degrees F). NOTE: This  sheet is a summary. It may not cover all possible information. If you have questions about this medicine, talk to your doctor, pharmacist, or health care provider.  2018 Elsevier/Gold Standard (2013-09-29 15:26:50)  

## 2017-10-18 NOTE — Telephone Encounter (Signed)
meds updated.

## 2017-10-18 NOTE — Assessment & Plan Note (Addendum)
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 Denies any hot flashes. She does have a heat sensation from below the waist level.  Right breast pain: patient is having pain at her previous mastectomy and implant site.  This pain is more neuropathic in origin.  She met with myself and Dr.  to review her plan.  She will start Gabapentin 100mg po qhs.  She was given detailed info about this in her AVS.  She was also recommended to f/u with Dr. Dillingham about this discomfort to determine possible solutions.    [name] will return in October, for her routine f/u with Dr. .  She will be due for mammogram in 01/2018, which she knows needs to be scheduled.     

## 2017-10-19 ENCOUNTER — Telehealth: Payer: Self-pay | Admitting: Adult Health

## 2017-10-19 NOTE — Telephone Encounter (Signed)
Per 3/4 no los °

## 2017-11-18 ENCOUNTER — Other Ambulatory Visit: Payer: Self-pay | Admitting: Hematology and Oncology

## 2017-11-20 ENCOUNTER — Ambulatory Visit
Admission: RE | Admit: 2017-11-20 | Discharge: 2017-11-20 | Disposition: A | Discharge status: Home / Self Care | Payer: Self-pay | Source: Ambulatory Visit | Attending: Gastroenterology | Admitting: Gastroenterology

## 2017-11-20 DIAGNOSIS — K769 Liver disease, unspecified: Secondary | ICD-10-CM

## 2017-11-20 MED ORDER — GADOBENATE DIMEGLUMINE 529 MG/ML IV SOLN
15.0000 mL | Freq: Once | INTRAVENOUS | Status: AC | PRN
  Administered 2017-11-20: 15 mL via INTRAVENOUS

## 2017-11-21 ENCOUNTER — Encounter: Payer: Self-pay | Admitting: Hematology and Oncology

## 2017-11-23 ENCOUNTER — Telehealth: Payer: Self-pay | Admitting: Hematology and Oncology

## 2017-11-23 NOTE — Telephone Encounter (Signed)
I discussed with the patient about the MRI results.  She already had discussed the MRI report with Dr. Benson [place]. I agree with the current plan of monitoring this and repeating another MRI in 3 months.  Dr. Geoffry Paradise has been great and monitoring her liver issues.  I offered her support and asked her to call us back if she develops any further problems related to the liver or her breast cancer.

## 2017-11-27 ENCOUNTER — Other Ambulatory Visit: Payer: 59

## 2017-11-30 ENCOUNTER — Telehealth: Payer: Self-pay | Admitting: Hematology and Oncology

## 2017-11-30 NOTE — Telephone Encounter (Signed)
Patient called to cancel °

## 2017-12-01 ENCOUNTER — Other Ambulatory Visit: Payer: 59

## 2017-12-20 ENCOUNTER — Other Ambulatory Visit: Payer: Self-pay | Admitting: Adult Health

## 2017-12-20 ENCOUNTER — Other Ambulatory Visit: Payer: Self-pay | Admitting: Hematology and Oncology

## 2017-12-20 DIAGNOSIS — Z1231 Encounter for screening mammogram for malignant neoplasm of breast: Secondary | ICD-10-CM

## 2018-01-19 ENCOUNTER — Ambulatory Visit
Admission: RE | Admit: 2018-01-19 | Discharge: 2018-01-19 | Disposition: A | Discharge status: Home / Self Care | Payer: BLUE CROSS/BLUE SHIELD | Source: Ambulatory Visit | Attending: Hematology and Oncology | Admitting: Hematology and Oncology

## 2018-01-19 ENCOUNTER — Other Ambulatory Visit: Payer: Self-pay | Admitting: Gastroenterology

## 2018-01-19 DIAGNOSIS — Z1231 Encounter for screening mammogram for malignant neoplasm of breast: Secondary | ICD-10-CM

## 2018-01-19 DIAGNOSIS — K769 Liver disease, unspecified: Secondary | ICD-10-CM

## 2018-01-19 HISTORY — DX: Personal history of irradiation: Z92.3

## 2018-01-27 ENCOUNTER — Other Ambulatory Visit: Payer: Self-pay | Admitting: Hematology and Oncology

## 2018-02-09 IMAGING — MR MR BREAST BX W/ LOC DEV 1ST LEASION IMAGE BX SPEC MR GUIDE*R*
11 of 16 series · 33 of 48 positions shown · IV contrast (20ml Multihance)
Comparison: Previous exams.

ADDENDUM:
Pathology revealed FIBROCYSTIC CHANGES WITH ADENOSIS AND
CALCIFICATIONS of the lower outer quadrant of the Left breast. GRADE
I INVASIVE DUCTAL CARCINOMA of the upper inner quadrant of the Right
breast. This was found to be concordant by Dr. Zorow Goolaup.
Pathology results were discussed with the patient by telephone. The
patient reported doing well after the biopsies with minimal bleeding
and tenderness at the sites. Post biopsy instructions and care were
reviewed and questions were answered. The patient was encouraged to
call The [REDACTED] for any additional
concerns. The patient has a recent diagnosis of right breast cancer
and should follow her outlined treatment plan.

Pathology results reported by Caleb Aujla, RN on 11/20/2015.
CLINICAL DATA: 9 mm irregular mass in the upper inner quadrant of
the right breast on a recent breast staging MRI. Recently diagnosed
invasive ductal carcinoma in the upper-outer quadrant of the right
breast.
EXAM:
MRI GUIDED CORE NEEDLE BIOPSY OF THE RIGHT BREAST
TECHNIQUE: Multiplanar, multisequence MR imaging of the right breast was
performed both before and after administration of intravenous
contrast.
CONTRAST:  20mL MULTIHANCE GADOBENATE DIMEGLUMINE 529 MG/ML IV SOLN

[Series 2: axial pre-cm · axial · non-contrast · 1.3mm · 0.89mm/px · z∈[-69,+117]mm · 3 of 144 slices shown]
[im 1/144]
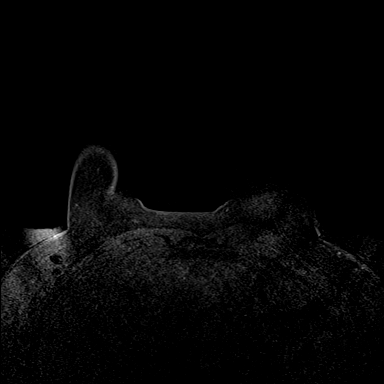
[im 72/144]
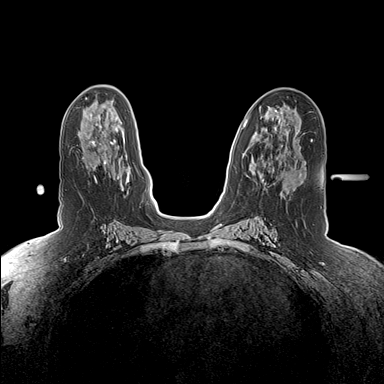
[im 144/144]
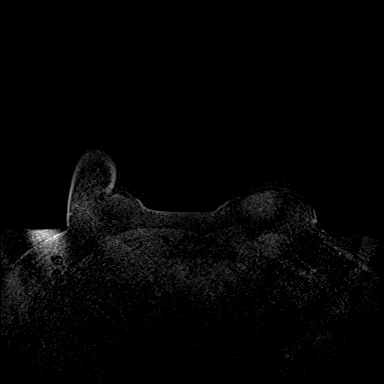

[Series 3: axial pre-cm_s2_(id) · axial · non-contrast · 1.3mm · 0.89mm/px · z∈[-69,+117]mm · 3 of 144 slices shown]
[im 1/144]
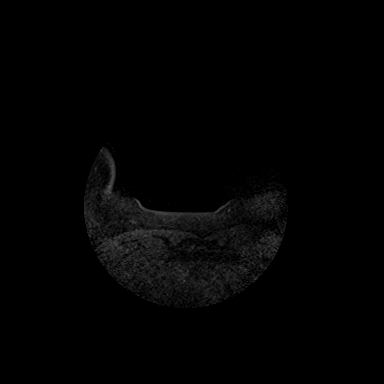
[im 72/144]
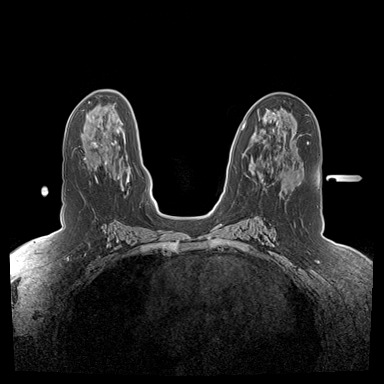
[im 144/144]
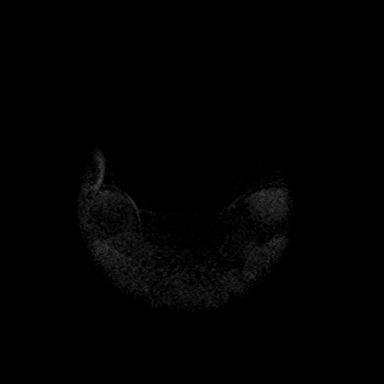

[Series 4: axial post 20 · axial · 1.3mm · 0.89mm/px · z∈[-69,+117]mm · 3 of 144 slices shown (1 of 3)]
[im 1/144]
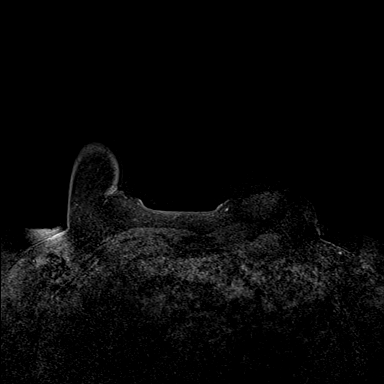
[im 72/144]
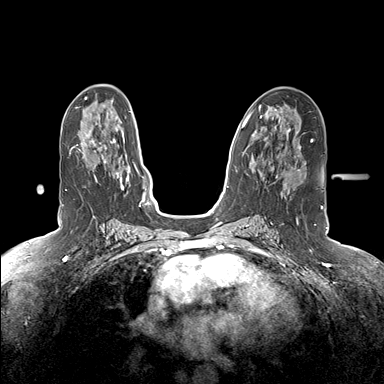
[im 144/144]
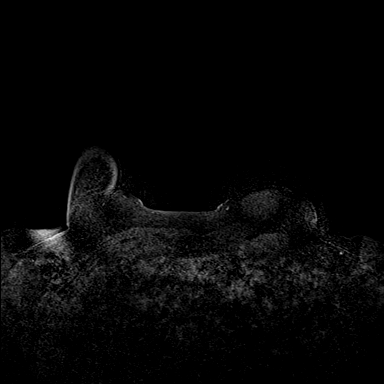

[Series 5: axial post 20 · axial · 1.3mm · 0.89mm/px · z∈[-69,+117]mm · 3 of 144 slices shown (2 of 3)]
[im 1/144]
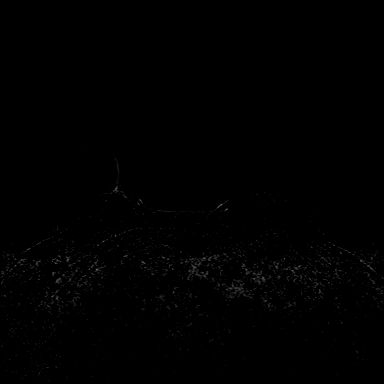
[im 72/144]
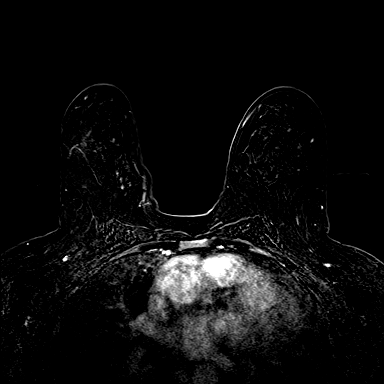
[im 144/144]
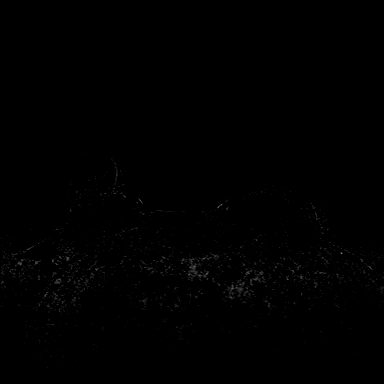

[Series 6: axial post 20 · axial · 1.3mm · 0.89mm/px · z∈[-69,+117]mm · 3 of 144 slices shown (3 of 3)]
[im 1/144]
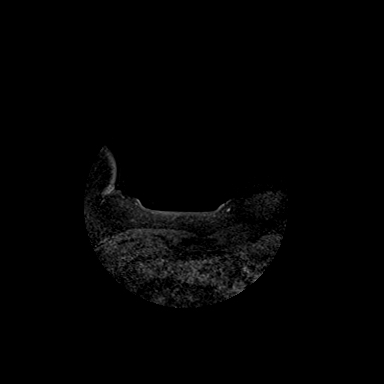
[im 72/144]
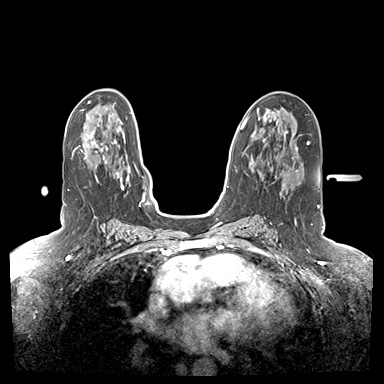
[im 144/144]
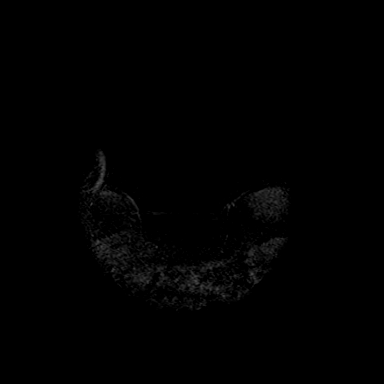

[Series 7: axial post 3 · axial · 1.3mm · 0.89mm/px · z∈[-69,+117]mm · 3 of 144 slices shown (1 of 3)]
[im 1/144]
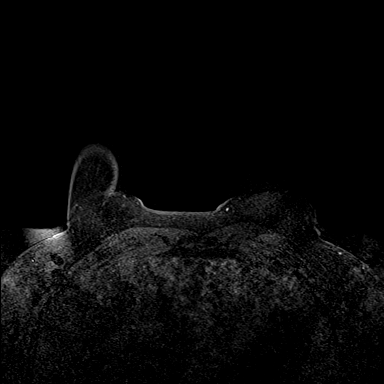
[im 72/144]
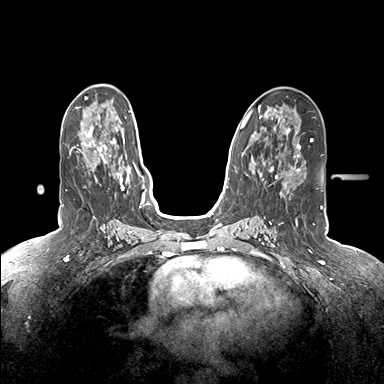
[im 144/144]
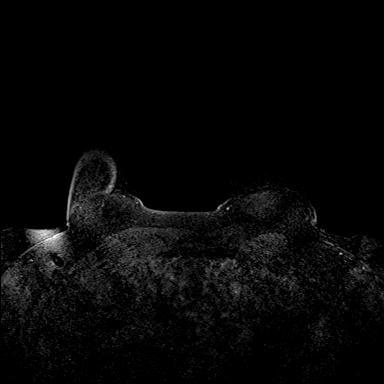

[Series 8: axial post 3 · axial · 1.3mm · 0.89mm/px · z∈[-69,+117]mm · 3 of 144 slices shown (2 of 3)]
[im 1/144]
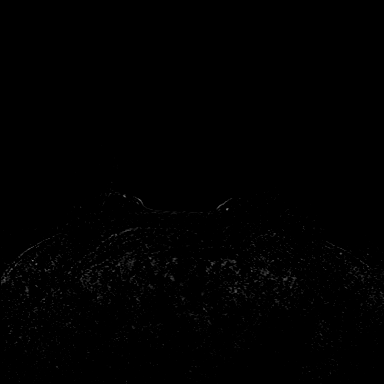
[im 72/144]
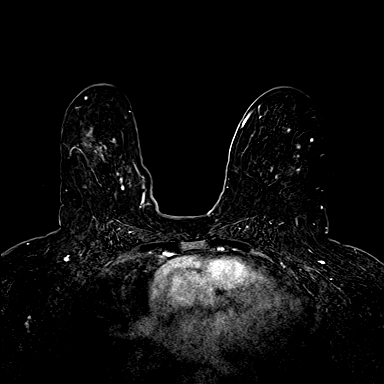
[im 144/144]
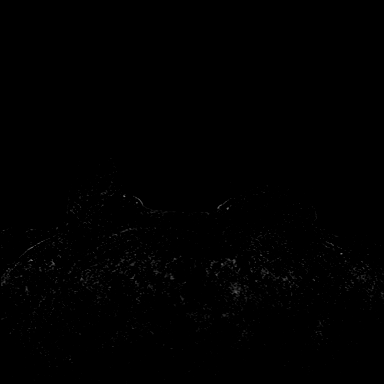

[Series 9: axial post 3 · axial · 1.3mm · 0.89mm/px · z∈[-69,+117]mm · 3 of 144 slices shown (3 of 3)]
[im 1/144]
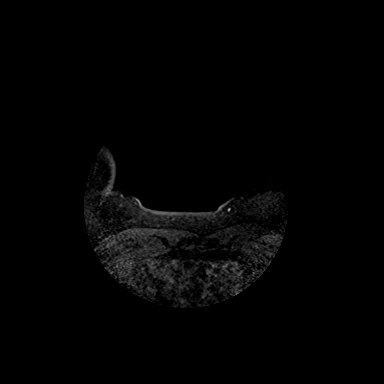
[im 72/144]
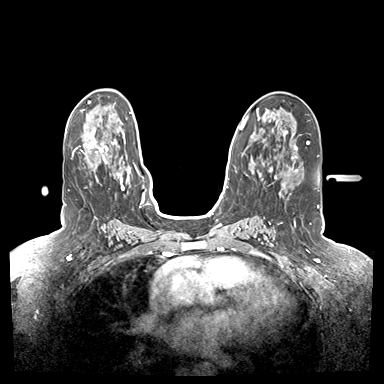
[im 144/144]
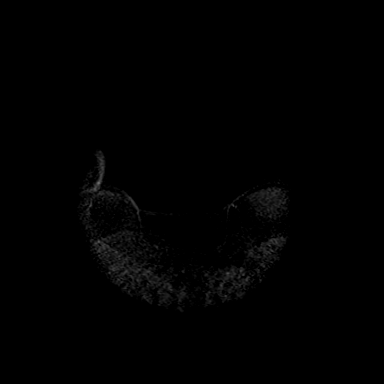

[Series 10: axial confirmation · axial · 1.3mm · 0.89mm/px · z∈[-69,+117]mm · 3 of 144 slices shown]
[im 1/144]
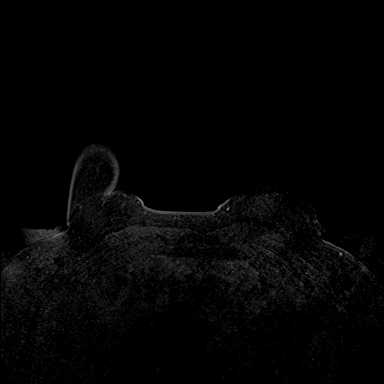
[im 72/144]
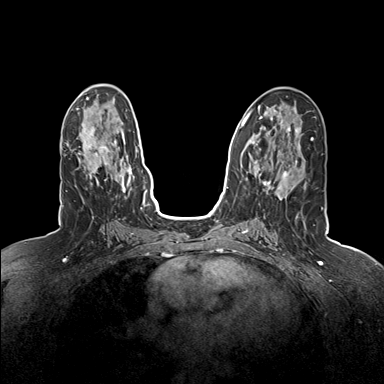
[im 144/144]
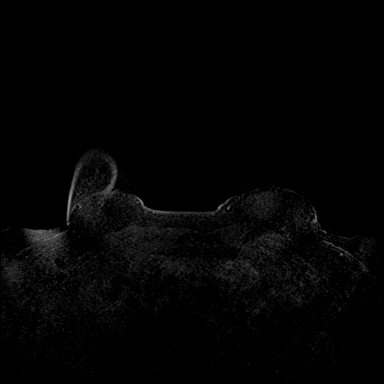

[Series 11: axial confirmation_sub · axial · 1.3mm · 0.89mm/px · z∈[-69,+117]mm · 3 of 144 slices shown]
[im 1/144]
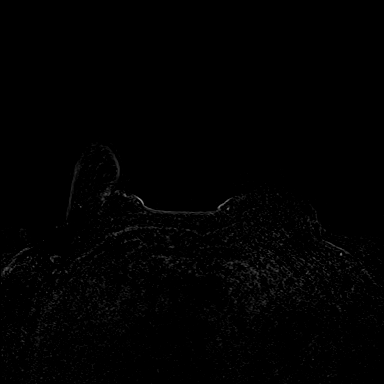
[im 72/144]
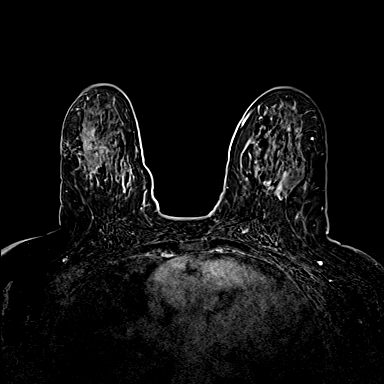
[im 144/144]
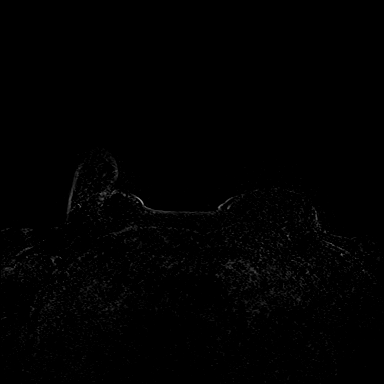

[Series 12: axial post clip · axial · 1.3mm · 0.89mm/px · z∈[-69,+117]mm · 3 of 144 slices shown]
[im 1/144]
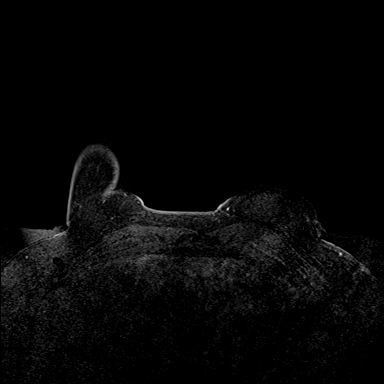
[im 72/144]
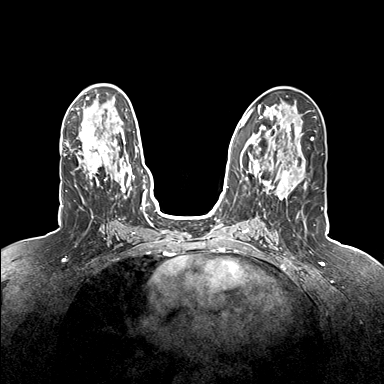
[im 144/144]
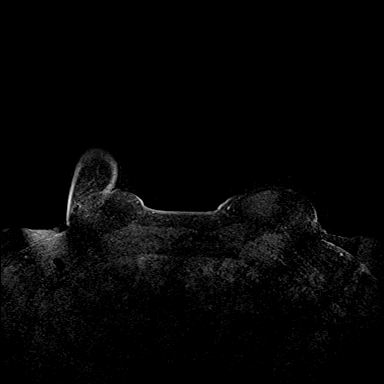

[33 of 48 positions shown; findings below may reference images not displayed]

FINDINGS: I met with the patient, and we discussed the procedure of MRI guided
biopsy, including risks, benefits, and alternatives. Specifically,
we discussed the risks of infection, bleeding, tissue injury, clip
migration, and inadequate sampling. Informed, written consent was
given. The usual time out protocol was performed immediately prior
to the procedure.

Using sterile technique, 1% Lidocaine, MRI guidance, and a 9 gauge
vacuum assisted device, biopsy was performed of the recently
demonstrated 9 mm irregular mass in the upper inner quadrant of the
right breast in the middle third using a lateral approach. At the
conclusion of the procedure, a dumbbell-shaped tissue marker clip
was deployed into the biopsy cavity. Follow-up 2-view mammogram was
performed and dictated separately.
IMPRESSION: MRI guided biopsy of a 9 mm mass in the upper inner quadrant of the
right breast. No apparent complications.

## 2018-02-11 ENCOUNTER — Other Ambulatory Visit: Payer: BLUE CROSS/BLUE SHIELD

## 2018-02-14 ENCOUNTER — Ambulatory Visit
Admission: RE | Admit: 2018-02-14 | Discharge: 2018-02-14 | Disposition: A | Discharge status: Home / Self Care | Payer: BLUE CROSS/BLUE SHIELD | Source: Ambulatory Visit | Attending: Gastroenterology | Admitting: Gastroenterology

## 2018-02-14 DIAGNOSIS — K769 Liver disease, unspecified: Secondary | ICD-10-CM

## 2018-02-14 MED ORDER — GADOBENATE DIMEGLUMINE 529 MG/ML IV SOLN
15.0000 mL | Freq: Once | INTRAVENOUS | Status: AC | PRN
  Administered 2018-02-14: 15 mL via INTRAVENOUS

## 2018-04-06 ENCOUNTER — Telehealth: Payer: Self-pay

## 2018-04-06 NOTE — Telephone Encounter (Signed)
Pt has moved to [place] and has appt with Dr Audie Box at [facility].  Pt has been instructed to fax [place] a signed consent to release medical records.  Pt verbalized understanding.

## 2018-04-08 ENCOUNTER — Telehealth: Payer: Self-pay | Admitting: *Deleted

## 2018-04-08 NOTE — Telephone Encounter (Signed)
ROI faxed to [facility]; release [phone]

## 2018-05-06 ENCOUNTER — Other Ambulatory Visit: Payer: Self-pay | Admitting: Hematology and Oncology

## 2018-05-29 ENCOUNTER — Other Ambulatory Visit: Payer: Self-pay | Admitting: Hematology and Oncology

## 2018-06-09 ENCOUNTER — Inpatient Hospital Stay: Payer: Self-pay | Attending: Hematology and Oncology | Admitting: Hematology and Oncology

## 2018-06-09 NOTE — Assessment & Plan Note (Deleted)
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 Denies any hot flashes. She does have a heat sensation from below the waist level.  Right breast pain:  Breast cancer surveillance: 1.  Breast exam 06/09/2018: 2. mammogram left: 01/19/2018: No evidence of malignancy breast density category C  Liver MRI 02/14/2018 for evaluation of cirrhosis: No significant abnormal lesions [facility] identified [facility]  Return to clinic in 1 year for follow-up

## 2018-07-06 ENCOUNTER — Other Ambulatory Visit: Payer: Self-pay | Admitting: Hematology and Oncology

## 2018-07-08 ENCOUNTER — Other Ambulatory Visit: Payer: Self-pay | Admitting: Hematology and Oncology
